# Patient Record
Sex: Female | Born: 1965
Health system: Southern US, Community
[De-identification: ages and names within clinical notes are randomized; demographics above are authoritative.]

## PROBLEM LIST (undated history)

## (undated) DIAGNOSIS — IMO0001 Reserved for inherently not codable concepts without codable children: Secondary | ICD-10-CM

## (undated) DIAGNOSIS — Z923 Personal history of irradiation: Secondary | ICD-10-CM

## (undated) DIAGNOSIS — O039 Complete or unspecified spontaneous abortion without complication: Secondary | ICD-10-CM

## (undated) DIAGNOSIS — IMO0002 Reserved for concepts with insufficient information to code with codable children: Secondary | ICD-10-CM

## (undated) DIAGNOSIS — Z803 Family history of malignant neoplasm of breast: Secondary | ICD-10-CM

## (undated) DIAGNOSIS — Z9889 Other specified postprocedural states: Secondary | ICD-10-CM

## (undated) DIAGNOSIS — C50919 Malignant neoplasm of unspecified site of unspecified female breast: Secondary | ICD-10-CM

## (undated) DIAGNOSIS — R112 Nausea with vomiting, unspecified: Secondary | ICD-10-CM

## (undated) HISTORY — DX: Family history of malignant neoplasm of breast: Z80.3

## (undated) HISTORY — DX: Complete or unspecified spontaneous abortion without complication: O03.9

## (undated) HISTORY — DX: Personal history of irradiation: Z92.3

---

## 2001-12-05 DIAGNOSIS — O039 Complete or unspecified spontaneous abortion without complication: Secondary | ICD-10-CM

## 2001-12-05 HISTORY — DX: Complete or unspecified spontaneous abortion without complication: O03.9

## 2005-12-05 HISTORY — PX: DILATION AND CURETTAGE OF UTERUS: SHX78

## 2013-12-05 HISTORY — PX: BREAST BIOPSY: SHX20

## 2014-05-05 DIAGNOSIS — C50919 Malignant neoplasm of unspecified site of unspecified female breast: Secondary | ICD-10-CM

## 2014-05-05 HISTORY — DX: Malignant neoplasm of unspecified site of unspecified female breast: C50.919

## 2014-05-28 ENCOUNTER — Other Ambulatory Visit: Payer: Self-pay | Admitting: Radiology

## 2014-06-10 ENCOUNTER — Encounter (INDEPENDENT_AMBULATORY_CARE_PROVIDER_SITE_OTHER): Payer: Self-pay | Admitting: General Surgery

## 2014-06-10 ENCOUNTER — Ambulatory Visit (INDEPENDENT_AMBULATORY_CARE_PROVIDER_SITE_OTHER): Payer: Managed Care, Other (non HMO) | Admitting: General Surgery

## 2014-06-10 VITALS — BP 122/76 | HR 55 | Temp 97.6°F | Ht 64.0 in | Wt 132.0 lb

## 2014-06-10 DIAGNOSIS — N6089 Other benign mammary dysplasias of unspecified breast: Secondary | ICD-10-CM

## 2014-06-10 DIAGNOSIS — N6091 Unspecified benign mammary dysplasia of right breast: Secondary | ICD-10-CM | POA: Insufficient documentation

## 2014-06-10 NOTE — Patient Instructions (Signed)
[facility] °Office Phone Number [phone] ° °BREAST BIOPSY/ PARTIAL MASTECTOMY: POST OP INSTRUCTIONS ° °Always review your discharge instruction sheet given to you by the facility where your surgery was performed. ° °IF YOU HAVE DISABILITY OR FAMILY LEAVE FORMS, YOU MUST BRING THEM TO THE OFFICE FOR PROCESSING.  DO NOT GIVE THEM TO YOUR DOCTOR. ° °1. A prescription for pain medication may be given to you upon discharge.  Take your pain medication as prescribed, if needed.  If narcotic pain medicine is not needed, then you may take acetaminophen (Tylenol) or ibuprofen (Advil) as needed. °2. Take your usually prescribed medications unless otherwise directed °3. If you need a refill on your pain medication, please contact your pharmacy.  They will contact our office to request authorization.  Prescriptions will not be filled after 5pm or on week-ends. °4. You should eat very light the first 24 hours after surgery, such as soup, crackers, pudding, etc.  Resume your normal diet the day after surgery. °5. Most patients will experience some swelling and bruising in the breast.  Ice packs and a good support bra will help.  Swelling and bruising can take several days to resolve.  °6. It is common to experience some constipation if taking pain medication after surgery.  Increasing fluid intake and taking a stool softener will usually help or prevent this problem from occurring.  A mild laxative (Milk of Magnesia or Miralax) should be taken according to package directions if there are no bowel movements after 48 hours. °7. Unless discharge instructions indicate otherwise, you may remove your bandages 24-48 hours after surgery, and you may shower at that time.  You may have steri-strips (small skin tapes) in place directly over the incision.  These strips should be left on the skin for 7-10 days.  If your surgeon used skin glue on the incision, you may shower in 24 hours.  The glue will flake off over the  next 2-3 weeks.  Any sutures or staples will be removed at the office during your follow-up visit. °8. ACTIVITIES:  You may resume regular daily activities (gradually increasing) beginning the next day.  Wearing a good support bra or sports bra minimizes pain and swelling.  You may have sexual intercourse when it is comfortable. °a. You may drive when you no longer are taking prescription pain medication, you can comfortably wear a seatbelt, and you can safely maneuver your car and apply brakes. °b. RETURN TO WORK:  ______________________________________________________________________________________ °9. You should see your doctor in the office for a follow-up appointment approximately two weeks after your surgery.  Your doctor’s nurse will typically make your follow-up appointment when she calls you with your pathology report.  Expect your pathology report 2-3 business days after your surgery.  You may call to check if you do not hear from us after three days. °10. OTHER INSTRUCTIONS: _______________________________________________________________________________________________ _____________________________________________________________________________________________________________________________________ °_____________________________________________________________________________________________________________________________________ °_____________________________________________________________________________________________________________________________________ ° °WHEN TO CALL YOUR DOCTOR: °1. Fever over 101.0 °2. Nausea and/or vomiting. °3. Extreme swelling or bruising. °4. Continued bleeding from incision. °5. Increased pain, redness, or drainage from the incision. ° °The clinic staff is available to answer your questions during regular business hours.  Please don’t hesitate to call and ask to speak to one of the nurses for clinical concerns.  If you have a medical emergency, go to the nearest  emergency room or call 911.  A surgeon from [facility] is always on call at the hospital. ° °For further questions, please visit [email or web address]  °

## 2014-06-10 NOTE — Progress Notes (Signed)
Patient ID: Sherry Weber, female   DOB: [birth year], 48 y.o.   MRN: [id number]  Chief Complaint  Patient presents with  . eval right breast    HPI Sherry Weber is a 48 y.o. female.   HPI  She is referred by Dr. Joanell Rising because of right breast atypical ductal hyperplasia.  She noticed a small lump in the right upper outer quadrant for about 2 months. She subsequently went for mammogram and ultrasound. This demonstrated an 8 mm oval lesion in the right breast with microcalcifications in the upper-outer quadrant region that appeared to be suspicious. There is also a small lymph node in the lower right axilla. A stereotactic biopsy of the 8 mm oval lesion in the right breast was performed and demonstrated atypical ductal hyperplasia. This is in a slightly different area than the palpable lump that she felt.  She had a paternal aunt with breast cancer. Age at menarche was 12. She's not had any children. No previous breast biopsies. No nipple discharge. She is otherwise healthy.  History reviewed. No pertinent past medical history.  History reviewed. No pertinent past surgical history.  History reviewed. No pertinent family history.  Social History History  Substance Use Topics  . Smoking status: Never Smoker   . Smokeless tobacco: Not on file  . Alcohol Use: No    No Known Allergies  No current outpatient prescriptions on file.   No current facility-administered medications for this visit.    Review of Systems Review of Systems  All other systems reviewed and are negative.   Blood pressure 122/76, pulse 55, temperature 97.6 F (36.4 C), height 5\' 4"  (1.626 m), weight 132 lb (59.875 kg).  Physical Exam Physical Exam  Constitutional: She appears well-developed and well-nourished. No distress.  HENT:  Head: Normocephalic and atraumatic.  Eyes: No scleral icterus.  Neck: Neck supple.  Cardiovascular: Normal rate and regular rhythm.   Pulmonary/Chest: Effort normal and breath  sounds normal.  Breasts are symmetrical in size. Left breast demonstrates no dominant masses or suspicious skin changes. Right breast demonstrates a small puncture wound laterally. No dominant masses.  Musculoskeletal: She exhibits no edema.  In the lower right axilla, there is a small 8-10 cm mobile subcutaneous soft tissue mass.  Lymphadenopathy:    She has no cervical adenopathy.  Neurological: She is alert.  Skin: Skin is warm and dry.  Psychiatric: She has a normal mood and affect. Her behavior is normal.    Data Reviewed Mammogram. Mammogram report. Pathology report.  Assessment    Atypical ductal hyperplasia of the right breast. Probably also has what appears to be a benign lymph node pending final pathology of the breast lesion.     Plan    I recommended right breast lumpectomy/biopsy after wire localization. I explained to her that this is a potentially premalignant condition.  I have explained the procedure, risks, and aftercare to her.  Risks include but are not limited to bleeding, infection, wound problems, breast deformity, anesthesia.  She seems to understand and agrees with the plan.        Tarig Zimmers J 06/10/2014, 5:01 PM

## 2014-06-25 ENCOUNTER — Encounter (HOSPITAL_BASED_OUTPATIENT_CLINIC_OR_DEPARTMENT_OTHER)
Admission: RE | Admit: 2014-06-25 | Discharge: 2014-06-25 | Disposition: A | Discharge status: Home / Self Care | Payer: Private Health Insurance - Indemnity | Source: Ambulatory Visit | Attending: General Surgery | Admitting: General Surgery

## 2014-06-25 ENCOUNTER — Encounter (HOSPITAL_BASED_OUTPATIENT_CLINIC_OR_DEPARTMENT_OTHER): Payer: Self-pay | Admitting: *Deleted

## 2014-06-25 LAB — CBC WITH DIFFERENTIAL/PLATELET
BASOS ABS: 0.1 10*3/uL (ref 0.0–0.1)
BASOS PCT: 1 % (ref 0–1)
EOS ABS: 0.2 10*3/uL (ref 0.0–0.7)
EOS PCT: 5 % (ref 0–5)
HCT: 40.9 % (ref 36.0–46.0)
Hemoglobin: 13.8 g/dL (ref 12.0–15.0)
LYMPHS ABS: 1.6 10*3/uL (ref 0.7–4.0)
Lymphocytes Relative: 34 % (ref 12–46)
MCH: 31.4 pg (ref 26.0–34.0)
MCHC: 33.7 g/dL (ref 30.0–36.0)
MCV: 93 fL (ref 78.0–100.0)
Monocytes Absolute: 0.3 10*3/uL (ref 0.1–1.0)
Monocytes Relative: 7 % (ref 3–12)
Neutro Abs: 2.5 10*3/uL (ref 1.7–7.7)
Neutrophils Relative %: 53 % (ref 43–77)
PLATELETS: 295 10*3/uL (ref 150–400)
RBC: 4.4 MIL/uL (ref 3.87–5.11)
RDW: 12.4 % (ref 11.5–15.5)
WBC: 4.7 10*3/uL (ref 4.0–10.5)

## 2014-06-25 LAB — PROTIME-INR
INR: 0.91 (ref 0.00–1.49)
Prothrombin Time: 12.3 seconds (ref 11.6–15.2)

## 2014-06-25 LAB — COMPREHENSIVE METABOLIC PANEL
ALBUMIN: 3.9 g/dL (ref 3.5–5.2)
ALT: 29 U/L (ref 0–35)
ANION GAP: 11 (ref 5–15)
AST: 25 U/L (ref 0–37)
Alkaline Phosphatase: 46 U/L (ref 39–117)
BUN: 15 mg/dL (ref 6–23)
CALCIUM: 8.8 mg/dL (ref 8.4–10.5)
CO2: 28 mEq/L (ref 19–32)
Chloride: 101 mEq/L (ref 96–112)
Creatinine, Ser: 0.65 mg/dL (ref 0.50–1.10)
GFR calc non Af Amer: >90 mL/min (ref >90)
GLUCOSE: 106 mg/dL — AB (ref 70–99)
Potassium: 3.9 mEq/L (ref 3.7–5.3)
SODIUM: 140 meq/L (ref 137–147)
Total Bilirubin: 0.4 mg/dL (ref 0.3–1.2)
Total Protein: 6.7 g/dL (ref 6.0–8.3)

## 2014-06-25 NOTE — Progress Notes (Signed)
To come in for [facility] labs-from [place]-but speaks english well-husband [place]

## 2014-06-27 ENCOUNTER — Encounter (HOSPITAL_BASED_OUTPATIENT_CLINIC_OR_DEPARTMENT_OTHER): Payer: Self-pay | Admitting: Anesthesiology

## 2014-06-27 ENCOUNTER — Ambulatory Visit (HOSPITAL_BASED_OUTPATIENT_CLINIC_OR_DEPARTMENT_OTHER): Payer: Private Health Insurance - Indemnity | Admitting: Anesthesiology

## 2014-06-27 ENCOUNTER — Ambulatory Visit (HOSPITAL_BASED_OUTPATIENT_CLINIC_OR_DEPARTMENT_OTHER)
Admission: RE | Admit: 2014-06-27 | Discharge: 2014-06-27 | Disposition: A | Discharge status: Home / Self Care | Payer: Private Health Insurance - Indemnity | Source: Ambulatory Visit | Attending: General Surgery | Admitting: General Surgery

## 2014-06-27 ENCOUNTER — Encounter (HOSPITAL_BASED_OUTPATIENT_CLINIC_OR_DEPARTMENT_OTHER)
Admission: RE | Disposition: A | Discharge status: Home / Self Care | Payer: Self-pay | Source: Ambulatory Visit | Attending: General Surgery

## 2014-06-27 ENCOUNTER — Encounter (HOSPITAL_BASED_OUTPATIENT_CLINIC_OR_DEPARTMENT_OTHER): Payer: Private Health Insurance - Indemnity | Admitting: Anesthesiology

## 2014-06-27 DIAGNOSIS — D0590 Unspecified type of carcinoma in situ of unspecified breast: Secondary | ICD-10-CM

## 2014-06-27 DIAGNOSIS — Z803 Family history of malignant neoplasm of breast: Secondary | ICD-10-CM | POA: Insufficient documentation

## 2014-06-27 DIAGNOSIS — Z01812 Encounter for preprocedural laboratory examination: Secondary | ICD-10-CM | POA: Insufficient documentation

## 2014-06-27 HISTORY — PX: BREAST SURGERY: SHX581

## 2014-06-27 HISTORY — PX: BREAST LUMPECTOMY WITH NEEDLE LOCALIZATION: SHX5759

## 2014-06-27 HISTORY — PX: BREAST LUMPECTOMY: SHX2

## 2014-06-27 SURGERY — BREAST LUMPECTOMY WITH NEEDLE LOCALIZATION
Anesthesia: General | Site: Breast | Laterality: Right

## 2014-06-27 MED ORDER — MORPHINE SULFATE 2 MG/ML IJ SOLN: 2.0000 mg | INTRAMUSCULAR | Status: DC | PRN

## 2014-06-27 MED ORDER — SODIUM BICARBONATE 4 % IV SOLN
INTRAVENOUS | Status: AC
  Filled 2014-06-27: qty 5

## 2014-06-27 MED ORDER — FENTANYL CITRATE 0.05 MG/ML IJ SOLN: 50.0000 ug | INTRAMUSCULAR | Status: DC | PRN

## 2014-06-27 MED ORDER — BUPIVACAINE HCL (PF) 0.5 % IJ SOLN
INTRAMUSCULAR | Status: DC | PRN
  Administered 2014-06-27: 14 mL

## 2014-06-27 MED ORDER — PROPOFOL 10 MG/ML IV BOLUS
INTRAVENOUS | Status: AC
  Filled 2014-06-27: qty 20

## 2014-06-27 MED ORDER — BUPIVACAINE HCL (PF) 0.5 % IJ SOLN
INTRAMUSCULAR | Status: AC
  Filled 2014-06-27: qty 30

## 2014-06-27 MED ORDER — LIDOCAINE HCL (PF) 1 % IJ SOLN
INTRAMUSCULAR | Status: AC
  Filled 2014-06-27: qty 30

## 2014-06-27 MED ORDER — FENTANYL CITRATE 0.05 MG/ML IJ SOLN
INTRAMUSCULAR | Status: DC | PRN
  Administered 2014-06-27: 25 ug via INTRAVENOUS
  Administered 2014-06-27: 50 ug via INTRAVENOUS
  Administered 2014-06-27: 25 ug via INTRAVENOUS

## 2014-06-27 MED ORDER — LACTATED RINGERS IV SOLN
INTRAVENOUS | Status: DC
  Administered 2014-06-27 (×2): via INTRAVENOUS

## 2014-06-27 MED ORDER — OXYCODONE HCL 5 MG PO TABS: 5.0000 mg | ORAL_TABLET | ORAL | Status: DC | PRN

## 2014-06-27 MED ORDER — HYDROCODONE-ACETAMINOPHEN 5-325 MG PO TABS: 1.0000 | ORAL_TABLET | ORAL | Status: DC | PRN

## 2014-06-27 MED ORDER — DEXAMETHASONE SODIUM PHOSPHATE 4 MG/ML IJ SOLN
INTRAMUSCULAR | Status: DC | PRN
  Administered 2014-06-27: 8 mg via INTRAVENOUS

## 2014-06-27 MED ORDER — PROPOFOL 10 MG/ML IV BOLUS
INTRAVENOUS | Status: DC | PRN
  Administered 2014-06-27: 110 mg via INTRAVENOUS

## 2014-06-27 MED ORDER — MIDAZOLAM HCL 5 MG/5ML IJ SOLN
INTRAMUSCULAR | Status: DC | PRN
  Administered 2014-06-27: 2 mg via INTRAVENOUS

## 2014-06-27 MED ORDER — FENTANYL CITRATE 0.05 MG/ML IJ SOLN
INTRAMUSCULAR | Status: AC
  Filled 2014-06-27: qty 4

## 2014-06-27 MED ORDER — ONDANSETRON HCL 4 MG/2ML IJ SOLN
INTRAMUSCULAR | Status: DC | PRN
  Administered 2014-06-27: 4 mg via INTRAVENOUS

## 2014-06-27 MED ORDER — LIDOCAINE HCL (CARDIAC) 20 MG/ML IV SOLN
INTRAVENOUS | Status: DC | PRN
  Administered 2014-06-27: 80 mg via INTRAVENOUS

## 2014-06-27 MED ORDER — OXYCODONE HCL 5 MG PO TABS
ORAL_TABLET | ORAL | Status: AC
  Filled 2014-06-27: qty 1

## 2014-06-27 MED ORDER — PROMETHAZINE HCL 25 MG/ML IJ SOLN: 6.2500 mg | INTRAMUSCULAR | Status: DC | PRN

## 2014-06-27 MED ORDER — ACETAMINOPHEN 325 MG PO TABS: 650.0000 mg | ORAL_TABLET | ORAL | Status: DC | PRN

## 2014-06-27 MED ORDER — CEFAZOLIN SODIUM-DEXTROSE 2-3 GM-% IV SOLR
2.0000 g | INTRAVENOUS | Status: AC
  Administered 2014-06-27: 2 g via INTRAVENOUS

## 2014-06-27 MED ORDER — ACETAMINOPHEN 500 MG PO TABS
ORAL_TABLET | ORAL | Status: AC
  Filled 2014-06-27: qty 2

## 2014-06-27 MED ORDER — OXYCODONE HCL 5 MG/5ML PO SOLN: 5.0000 mg | Freq: Once | ORAL | Status: AC | PRN

## 2014-06-27 MED ORDER — MIDAZOLAM HCL 2 MG/2ML IJ SOLN
INTRAMUSCULAR | Status: AC
  Filled 2014-06-27: qty 2

## 2014-06-27 MED ORDER — MIDAZOLAM HCL 2 MG/2ML IJ SOLN: 1.0000 mg | INTRAMUSCULAR | Status: DC | PRN

## 2014-06-27 MED ORDER — ACETAMINOPHEN 650 MG RE SUPP
650.0000 mg | RECTAL | Status: DC | PRN
Start: 2014-06-27 — End: 2014-06-27

## 2014-06-27 MED ORDER — SODIUM CHLORIDE 0.9 % IJ SOLN: 3.0000 mL | INTRAMUSCULAR | Status: DC | PRN

## 2014-06-27 MED ORDER — ACETAMINOPHEN 500 MG PO TABS
1000.0000 mg | ORAL_TABLET | Freq: Once | ORAL | Status: AC
  Administered 2014-06-27: 1000 mg via ORAL

## 2014-06-27 MED ORDER — HYDROMORPHONE HCL PF 1 MG/ML IJ SOLN: 0.2500 mg | INTRAMUSCULAR | Status: DC | PRN

## 2014-06-27 MED ORDER — OXYCODONE HCL 5 MG PO TABS
5.0000 mg | ORAL_TABLET | Freq: Once | ORAL | Status: AC | PRN
  Administered 2014-06-27: 5 mg via ORAL

## 2014-06-27 SURGICAL SUPPLY — 45 items
BENZOIN TINCTURE PRP APPL 2/3 (GAUZE/BANDAGES/DRESSINGS) ×3 IMPLANT
BINDER BREAST LRG (GAUZE/BANDAGES/DRESSINGS) IMPLANT
BINDER BREAST MEDIUM (GAUZE/BANDAGES/DRESSINGS) ×3 IMPLANT
BINDER BREAST XLRG (GAUZE/BANDAGES/DRESSINGS) IMPLANT
BINDER BREAST XXLRG (GAUZE/BANDAGES/DRESSINGS) IMPLANT
BLADE SURG 15 STRL LF DISP TIS (BLADE) ×1 IMPLANT
BLADE SURG 15 STRL SS (BLADE) ×2
CANISTER SUCT 1200ML W/VALVE (MISCELLANEOUS) IMPLANT
CHLORAPREP W/TINT 26ML (MISCELLANEOUS) ×3 IMPLANT
CLOSURE WOUND 1/2 X4 (GAUZE/BANDAGES/DRESSINGS) ×1
COVER MAYO STAND STRL (DRAPES) ×3 IMPLANT
COVER TABLE BACK 60X90 (DRAPES) ×3 IMPLANT
DECANTER SPIKE VIAL GLASS SM (MISCELLANEOUS) ×3 IMPLANT
DEVICE DUBIN W/COMP PLATE 8390 (MISCELLANEOUS) IMPLANT
DRAPE PED LAPAROTOMY (DRAPES) ×3 IMPLANT
DRAPE UTILITY XL STRL (DRAPES) ×3 IMPLANT
ELECT COATED BLADE 2.86 ST (ELECTRODE) ×3 IMPLANT
ELECT REM PT RETURN 9FT ADLT (ELECTROSURGICAL) ×3
ELECTRODE REM PT RTRN 9FT ADLT (ELECTROSURGICAL) ×1 IMPLANT
GAUZE SPONGE 4X4 12PLY STRL (GAUZE/BANDAGES/DRESSINGS) ×3 IMPLANT
GLOVE BIO SURGEON STRL SZ 6.5 (GLOVE) ×2 IMPLANT
GLOVE BIO SURGEONS STRL SZ 6.5 (GLOVE) ×1
GLOVE BIOGEL PI IND STRL 7.0 (GLOVE) ×2 IMPLANT
GLOVE BIOGEL PI IND STRL 8.5 (GLOVE) ×1 IMPLANT
GLOVE BIOGEL PI INDICATOR 7.0 (GLOVE) ×4
GLOVE BIOGEL PI INDICATOR 8.5 (GLOVE) ×2
GLOVE ECLIPSE 8.0 STRL XLNG CF (GLOVE) ×3 IMPLANT
GOWN STRL REUS W/ TWL LRG LVL3 (GOWN DISPOSABLE) ×2 IMPLANT
GOWN STRL REUS W/TWL LRG LVL3 (GOWN DISPOSABLE) ×4
NEEDLE HYPO 25X1 1.5 SAFETY (NEEDLE) ×3 IMPLANT
NS IRRIG 1000ML POUR BTL (IV SOLUTION) ×3 IMPLANT
PACK BASIN DAY SURGERY FS (CUSTOM PROCEDURE TRAY) ×3 IMPLANT
PENCIL BUTTON HOLSTER BLD 10FT (ELECTRODE) ×3 IMPLANT
SLEEVE SCD COMPRESS KNEE MED (MISCELLANEOUS) IMPLANT
SPONGE GAUZE 4X4 12PLY STER LF (GAUZE/BANDAGES/DRESSINGS) ×3 IMPLANT
STRIP CLOSURE SKIN 1/2X4 (GAUZE/BANDAGES/DRESSINGS) ×2 IMPLANT
SUT MON AB 4-0 PC3 18 (SUTURE) ×3 IMPLANT
SUT SILK 2 0 FS (SUTURE) ×3 IMPLANT
SUT VICRYL 3-0 CR8 SH (SUTURE) ×3 IMPLANT
SYRINGE CONTROL L 12CC (SYRINGE) ×3 IMPLANT
TOWEL OR 17X24 6PK STRL BLUE (TOWEL DISPOSABLE) ×6 IMPLANT
TOWEL OR NON WOVEN STRL DISP B (DISPOSABLE) ×3 IMPLANT
TUBE CONNECTING 20'X1/4 (TUBING)
TUBE CONNECTING 20X1/4 (TUBING) IMPLANT
YANKAUER SUCT BULB TIP NO VENT (SUCTIONS) IMPLANT

## 2014-06-27 NOTE — Anesthesia Preprocedure Evaluation (Signed)
Anesthesia Evaluation  Patient identified by MRN, date of birth, ID band Patient awake    Reviewed: Allergy & Precautions, H&P , NPO status , Patient's Chart, lab work & pertinent test results  History of Anesthesia Complications Negative for: history of anesthetic complications  Airway Mallampati: I  Neck ROM: Full    Dental no notable dental hx. (+) Teeth Intact   Pulmonary neg pulmonary ROS,  breath sounds clear to auscultation  Pulmonary exam normal       Cardiovascular negative cardio ROS  IRhythm:Regular Rate:Normal     Neuro/Psych negative neurological ROS  negative psych ROS   GI/Hepatic negative GI ROS, Neg liver ROS,   Endo/Other  negative endocrine ROS  Renal/GU negative Renal ROS  negative genitourinary   Musculoskeletal   Abdominal   Peds  Hematology negative hematology ROS (+)   Anesthesia Other Findings   Reproductive/Obstetrics negative OB ROS                           Anesthesia Physical Anesthesia Plan  ASA: I  Anesthesia Plan: General   Post-op Pain Management:    Induction: Intravenous  Airway Management Planned: LMA  Additional Equipment:   Intra-op Plan:   Post-operative Plan: Extubation in OR  Informed Consent: I have reviewed the patients History and Physical, chart, labs and discussed the procedure including the risks, benefits and alternatives for the proposed anesthesia with the patient or authorized representative who has indicated his/her understanding and acceptance.     Plan Discussed with: CRNA and Surgeon  Anesthesia Plan Comments:         Anesthesia Quick Evaluation

## 2014-06-27 NOTE — Transfer of Care (Signed)
Immediate Anesthesia Transfer of Care Note  Patient: Sherry Weber  Procedure(s) Performed: Procedure(s): RIGHT BREAST LUMPECTOMY WITH NEEDLE LOCALIZATION (Right)  Patient Location: PACU  Anesthesia Type:General  Level of Consciousness: sedated  Airway & Oxygen Therapy: Patient Spontanous Breathing and Patient connected to face mask oxygen  Post-op Assessment: Report given to PACU RN and Post -op Vital signs reviewed and stable  Post vital signs: Reviewed and stable  Complications: No apparent anesthesia complications

## 2014-06-27 NOTE — H&P (View-Only) (Signed)
Patient ID: Sherry Weber, female   DOB: 1966/03/20, 48 y.o.   MRN: [id number]  Chief Complaint  Patient presents with  . eval right breast    HPI Sherry Weber is a 48 y.o. female.   HPI  She is referred by Dr. Joanell Rising because of right breast atypical ductal hyperplasia.  She noticed a small lump in the right upper outer quadrant for about 2 months. She subsequently went for mammogram and ultrasound. This demonstrated an 8 mm oval lesion in the right breast with microcalcifications in the upper-outer quadrant region that appeared to be suspicious. There is also a small lymph node in the lower right axilla. A stereotactic biopsy of the 8 mm oval lesion in the right breast was performed and demonstrated atypical ductal hyperplasia. This is in a slightly different area than the palpable lump that she felt.  She had a paternal aunt with breast cancer. Age at menarche was 69. She's not had any children. No previous breast biopsies. No nipple discharge. She is otherwise healthy.  History reviewed. No pertinent past medical history.  History reviewed. No pertinent past surgical history.  History reviewed. No pertinent family history.  Social History History  Substance Use Topics  . Smoking status: Never Smoker   . Smokeless tobacco: Not on file  . Alcohol Use: No    No Known Allergies  No current outpatient prescriptions on file.   No current facility-administered medications for this visit.    Review of Systems Review of Systems  All other systems reviewed and are negative.   Blood pressure 122/76, pulse 55, temperature 97.6 F (36.4 C), height 5\' 4"  (1.626 m), weight 132 lb (59.875 kg).  Physical Exam Physical Exam  Constitutional: She appears well-developed and well-nourished. No distress.  HENT:  Head: Normocephalic and atraumatic.  Eyes: No scleral icterus.  Neck: Neck supple.  Cardiovascular: Normal rate and regular rhythm.   Pulmonary/Chest: Effort normal and breath  sounds normal.  Breasts are symmetrical in size. Left breast demonstrates no dominant masses or suspicious skin changes. Right breast demonstrates a small puncture wound laterally. No dominant masses.  Musculoskeletal: She exhibits no edema.  In the lower right axilla, there is a small 8-10 cm mobile subcutaneous soft tissue mass.  Lymphadenopathy:    She has no cervical adenopathy.  Neurological: She is alert.  Skin: Skin is warm and dry.  Psychiatric: She has a normal mood and affect. Her behavior is normal.    Data Reviewed Mammogram. Mammogram report. Pathology report.  Assessment    Atypical ductal hyperplasia of the right breast. Probably also has what appears to be a benign lymph node pending final pathology of the breast lesion.     Plan    I recommended right breast lumpectomy/biopsy after wire localization. I explained to her that this is a potentially premalignant condition.  I have explained the procedure, risks, and aftercare to her.  Risks include but are not limited to bleeding, infection, wound problems, breast deformity, anesthesia.  She seems to understand and agrees with the plan.        Sherry Weber 06/10/2014, 5:01 PM

## 2014-06-27 NOTE — Anesthesia Postprocedure Evaluation (Signed)
  Anesthesia Post-op Note  Patient: Sherry Weber  Procedure(s) Performed: Procedure(s): RIGHT BREAST LUMPECTOMY WITH NEEDLE LOCALIZATION (Right)  Patient Location: PACU  Anesthesia Type:General  Level of Consciousness: awake and alert   Airway and Oxygen Therapy: Patient Spontanous Breathing  Post-op Pain: mild  Post-op Assessment: Post-op Vital signs reviewed  Post-op Vital Signs: stable  Last Vitals:  Filed Vitals:   06/27/14 1235  BP: 137/86  Pulse: 49  Temp: 36.2 C  Resp: 18    Complications: No apparent anesthesia complications

## 2014-06-27 NOTE — Anesthesia Procedure Notes (Signed)
Procedure Name: LMA Insertion Date/Time: 06/27/2014 10:25 AM Performed by: Maryella Shivers Pre-anesthesia Checklist: Patient identified, Emergency Drugs available, Suction available and Patient being monitored Patient Re-evaluated:Patient Re-evaluated prior to inductionOxygen Delivery Method: Circle System Utilized Preoxygenation: Pre-oxygenation with 100% oxygen Intubation Type: IV induction Ventilation: Mask ventilation without difficulty LMA: LMA inserted LMA Size: 4.0 Number of attempts: 1 Airway Equipment and Method: bite block Placement Confirmation: positive ETCO2 Tube secured with: Tape Dental Injury: Teeth and Oropharynx as per pre-operative assessment

## 2014-06-27 NOTE — Op Note (Signed)
Operative Note  Sherry Weber female 48 y.o. 06/27/2014  PREOPERATIVE DX:  Right breast atypical ductal hyperplasia  POSTOPERATIVE DX:  Same  PROCEDURE:  Right breast lumpectomy after wire localization         Surgeon: [name]   Assistants: none  Anesthesia: General mask inhalational anesthesia and General LMA anesthesia  Indications:   This is a 48 year old female who felt a small lump in the upper outer quadrant of her right breast for about 2 months. She subsequently underwent a mammogram and ultrasound. This demonstrated a small, 8 mm, with oval lesion in the right breast with microcalcifications somewhat close to where she felt the lump. Biopsy demonstrated atypical ductal hyperplasia. She now presents for the above procedure.    Procedure Detail:  She underwent successful wire localization. She was seen in the holding area in the right breast mark my initials. She was brought to the operating room placed supine on the operating table Gen. Anesthesia was given. The bandage on the right breast was removed and the wire was cut closer to the skin. The wire was located at the 9:00 position of the right breast. The wire and breast were sterilely prepped and draped.  Marcaine solution was infiltrated in the lateral right breast area. A curvilinear incision was made through the skin and subcutaneous tissue to include the wire. Using electrocautery a lumpectomy was performed around the midportion and tip of the wire. Once the specimen was removed it was oriented with sutures. A specimen mammogram was performed. This demonstrated the area of concern to be in the middle of the specimen. This was verified by the radiologist. The specimen was then sent to pathology.  Was inspected and bleeding was controlled with electrocautery. Local anesthetic consisting of Marcaine solution was infiltrated into the subcutaneous tissue to the bone. Subcutaneous tissues were approximated using  interrupted 3-0 Vicryl sutures. The skin is closed with a running 4 Monocryl subcuticular stitch. Steri-Strips and a sterile dressing were applied.  She tolerated the procedure well without any apparent complications and was taken to the recovery room in satisfactory condition.   Estimated Blood Loss:  less than 100 mL         Drains: none  Blood Given: none          Specimens: Right breast tissue        Complications:  * No complications entered in OR log *         Disposition: PACU - hemodynamically stable.         Condition: stable

## 2014-06-27 NOTE — Discharge Instructions (Signed)
[place] Office Phone Number [phone]  BREAST BIOPSY/ PARTIAL MASTECTOMY: POST OP INSTRUCTIONS  Always review your discharge instruction sheet given to you by the facility where your surgery was performed.  IF YOU HAVE DISABILITY OR FAMILY LEAVE FORMS, YOU MUST BRING THEM TO THE OFFICE FOR PROCESSING.  DO NOT GIVE THEM TO YOUR DOCTOR.  1. A prescription for pain medication may be given to you upon discharge.  Take your pain medication as prescribed, if needed.  If narcotic pain medicine is not needed, then you may take acetaminophen (Tylenol) or ibuprofen (Advil) as needed. 2. Take your usually prescribed medications unless otherwise directed 3. If you need a refill on your pain medication, please contact your pharmacy.  They will contact our office to request authorization.  Prescriptions will not be filled after 5pm or on week-ends. 4. You should eat very light the first 24 hours after surgery, such as soup, crackers, pudding, etc.  Resume your normal diet the day after surgery. 5. Most patients will experience some swelling and bruising in the breast.  Ice packs and a good support bra will help.  Swelling and bruising can take several days to resolve.  6. It is common to experience some constipation if taking pain medication after surgery.  Increasing fluid intake and taking a stool softener will usually help or prevent this problem from occurring.  A mild laxative (Milk of Magnesia or Miralax) should be taken according to package directions if there are no bowel movements after 48 hours. 7. Unless discharge instructions indicate otherwise, you may remove your bandages 48 hours after surgery, and you may shower at that time.  You may have steri-strips (small skin tapes) in place directly over the incision.  These strips should be left on the skin for 10 days.  If your surgeon used skin glue on the incision, you may shower in 24 hours.  The glue will flake off over the next  2-3 weeks.  Any sutures or staples will be removed at the office during your follow-up visit. 8. ACTIVITIES:  You may resume regular daily activities (gradually increasing) beginning the next day.  Wearing a good support bra or sports bra minimizes pain and swelling.  You may have sexual intercourse when it is comfortable. a. You may drive when you no longer are taking prescription pain medication, you can comfortably wear a seatbelt, and you can safely maneuver your car and apply brakes. b. RETURN TO WORK:  3-5 days when comfortable._____________________________________________________________________________________ 9. You should see your doctor in the office for a follow-up appointment approximately two weeks after your surgery.  Please call and make this appointment.  Expect your pathology report 3 business days after your surgery.  You may call to check if you do not hear from [place] after three days. 10. OTHER INSTRUCTIONS: _______________________________________________________________________________________________ _____________________________________________________________________________________________________________________________________ _____________________________________________________________________________________________________________________________________ _____________________________________________________________________________________________________________________________________  WHEN TO CALL YOUR DOCTOR: 1. Fever over 101.0 2. Nausea and/or vomiting. 3. Extreme swelling or bruising. 4. Continued bleeding from incision. 5. Increased pain, redness, or drainage from the incision.  The clinic staff is available to answer your questions during regular business hours.  Please dont hesitate to call and ask to speak to one of the nurses for clinical concerns.  If you have a medical emergency, go to the nearest emergency room or call 911.  A surgeon from [facility] is always on call at the hospital.  For further questions, please visit [email or web address]    Post Anesthesia Home Care Instructions  Activity: Get plenty of rest for the remainder of the day. A responsible adult should stay with you for 24 hours following the procedure.  For the next 24 hours, DO NOT: -Drive a car -Paediatric nurse -Drink alcoholic beverages -Take any medication unless instructed by your physician -Make any legal decisions or sign important papers.  Meals: Start with liquid foods such as gelatin or soup. Progress to regular foods as tolerated. Avoid greasy, spicy, heavy foods. If nausea and/or vomiting occur, drink only clear liquids until the nausea and/or vomiting subsides. Call your physician if vomiting continues.  Special Instructions/Symptoms: Your throat may feel dry or sore from the anesthesia or the breathing tube placed in your throat during surgery. If this causes discomfort, gargle with warm salt water. The discomfort should disappear within 24 hours.

## 2014-06-27 NOTE — Interval H&P Note (Signed)
History and Physical Interval Note:  06/27/2014 10:17 AM  Sherry Weber  has presented today for surgery, with the diagnosis of atypical ductal hyperplasia right breast  The various methods of treatment have been discussed with the patient and family. After consideration of risks, benefits and other options for treatment, the patient has consented to  Procedure(s): RIGHT BREAST LUMPECTOMY WITH NEEDLE LOCALIZATION (Right) as a surgical intervention .  The patient's history has been reviewed, patient examined, no change in status, stable for surgery.  I have reviewed the patient's chart and labs.  Questions were answered to the patient's satisfaction.     Chaundra Abreu Lenna Sciara

## 2014-06-30 ENCOUNTER — Encounter (HOSPITAL_BASED_OUTPATIENT_CLINIC_OR_DEPARTMENT_OTHER): Payer: Self-pay | Admitting: General Surgery

## 2014-07-01 ENCOUNTER — Telehealth (INDEPENDENT_AMBULATORY_CARE_PROVIDER_SITE_OTHER): Payer: Self-pay

## 2014-07-01 NOTE — Telephone Encounter (Signed)
Gave the patient her pathology results.  DCIS, margins not involved. This is good news.  Patient had a difficult time understanding the diagnosis.  I told her Dr. Zella Richer would discuss in detail at her upcoming appointment in August.

## 2014-07-01 NOTE — Telephone Encounter (Signed)
Pt has some limitations with English, so is calling again still concerned about her diagnosis.  I explained that her cancer was non-invasive and contained within the ducts in the breast.  All of the tumor was removed and her mammogram showed that all margins were also clear of any cancer.  She may be referred to oncology just to establish care.  She was very appreciative of the call and will discuss her results further with Dr. Zella Richer at her post op appointment in August.

## 2014-07-17 ENCOUNTER — Encounter (INDEPENDENT_AMBULATORY_CARE_PROVIDER_SITE_OTHER): Payer: Self-pay | Admitting: General Surgery

## 2014-07-17 ENCOUNTER — Other Ambulatory Visit (INDEPENDENT_AMBULATORY_CARE_PROVIDER_SITE_OTHER): Payer: Self-pay | Admitting: General Surgery

## 2014-07-17 ENCOUNTER — Ambulatory Visit (INDEPENDENT_AMBULATORY_CARE_PROVIDER_SITE_OTHER): Payer: Managed Care, Other (non HMO) | Admitting: General Surgery

## 2014-07-17 VITALS — BP 122/78 | HR 58 | Temp 97.2°F | Ht 68.0 in | Wt 132.0 lb

## 2014-07-17 DIAGNOSIS — D0512 Intraductal carcinoma in situ of left breast: Secondary | ICD-10-CM

## 2014-07-17 DIAGNOSIS — D0511 Intraductal carcinoma in situ of right breast: Secondary | ICD-10-CM | POA: Insufficient documentation

## 2014-07-17 DIAGNOSIS — D0590 Unspecified type of carcinoma in situ of unspecified breast: Secondary | ICD-10-CM

## 2014-07-17 NOTE — Patient Instructions (Signed)
We will refer you to the oncologist and that person will discuss further treatment with you

## 2014-07-17 NOTE — Progress Notes (Signed)
Procedure:  Right breast lumpectomy after wire localization  Date:  06/27/2014  Pathology: Right breast Low-grade ductal carcinoma in situ. Margins are clear. Estrogen receptor and progesterone receptor positive.  History:  She is here for her first postoperative visit. She has some discomfort around the incision.  Exam: General- Is in NAD. 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 I have discussed this with her.  Plan:  Referral to medical oncology. Return visit 3 months.

## 2014-07-22 ENCOUNTER — Telehealth: Payer: Self-pay | Admitting: *Deleted

## 2014-07-22 NOTE — Telephone Encounter (Signed)
Called pt and got the voicemail but it has not been set up yet.  Will call again later to schedule a med onc appt.

## 2014-07-24 ENCOUNTER — Telehealth: Payer: Self-pay | Admitting: *Deleted

## 2014-07-24 NOTE — Telephone Encounter (Signed)
Called pt and confirmed 08/13/14 appt w/ her.  Mailed before appt letter, welcoming packet & intake form to pt.  Emailed Jearld Fenton and [place] at [employer] to make them aware.

## 2014-07-25 ENCOUNTER — Other Ambulatory Visit (INDEPENDENT_AMBULATORY_CARE_PROVIDER_SITE_OTHER): Payer: Self-pay | Admitting: General Surgery

## 2014-07-25 DIAGNOSIS — D0512 Intraductal carcinoma in situ of left breast: Secondary | ICD-10-CM

## 2014-07-30 ENCOUNTER — Encounter: Payer: Self-pay | Admitting: Radiation Oncology

## 2014-07-30 NOTE — Progress Notes (Signed)
Location of Breast Cancer:Right Breast upper-outer quadrant.8 mm   Histology per Pathology Report:05/28/14:FINAL DIAGNOSIS Diagnosis Breast, right, needle core biopsy - ATYPICAL DUCTAL HYPERPLASIA SEE COMMENT. - CALCIFICATIONS IDENTIFEID  Receptor Status: ER(+), PR (+), Her2-neu ()  Did patient present with symptoms (if so, please note symptoms) or was this found on screening mammography?:Patient noted lump in right upper quadrant for about 2 months, states it was a strange sensation and "felt like stretching of skin especially on reaching up high and during yoga exercises.  Past/Anticipated interventions by surgeon, if BEE:FEOFH breast lumpectomy  06/27/14 by Dr.Todd Zella Richer  Past/Anticipated interventions by medical oncology, if any: Chemotherapy:Scheduled for consultation with Dr.Gudena on 08/13/14.  Lymphedema issues, if any:No  Pain issues, if any:No  SAFETY ISSUES:  Prior radiation? No  Pacemaker/ICD?No  Possible current pregnancy?Menstrual cycle started 07/29/14  Is the patient on methotrexate?No  Current Complaints / other details:Married.menarche age 35. No children.had a miscarriage in 2007 at age 27. No significant health problems.A resident of [place] from [place] about 7 years and has lived in [place] for 2 years. Paternal aunt diagnosed with breast cancer in her 38's. NKDA.     Arlyss Repress, RN 07/30/2014,10:58 AM

## 2014-07-31 ENCOUNTER — Ambulatory Visit
Admission: RE | Admit: 2014-07-31 | Discharge: 2014-07-31 | Disposition: A | Discharge status: Home / Self Care | Payer: Private Health Insurance - Indemnity | Source: Ambulatory Visit | Attending: Radiation Oncology | Admitting: Radiation Oncology

## 2014-07-31 ENCOUNTER — Encounter: Payer: Self-pay | Admitting: Radiation Oncology

## 2014-07-31 VITALS — BP 114/68 | HR 57 | Temp 98.2°F | Wt 128.1 lb

## 2014-07-31 DIAGNOSIS — Z803 Family history of malignant neoplasm of breast: Secondary | ICD-10-CM | POA: Diagnosis not present

## 2014-07-31 DIAGNOSIS — D0511 Intraductal carcinoma in situ of right breast: Secondary | ICD-10-CM

## 2014-07-31 DIAGNOSIS — Z510 Encounter for antineoplastic radiation therapy: Secondary | ICD-10-CM | POA: Insufficient documentation

## 2014-07-31 DIAGNOSIS — D0590 Unspecified type of carcinoma in situ of unspecified breast: Secondary | ICD-10-CM | POA: Diagnosis not present

## 2014-07-31 DIAGNOSIS — Z170 Estrogen receptor positive status [ER+]: Secondary | ICD-10-CM | POA: Diagnosis not present

## 2014-07-31 HISTORY — DX: Malignant neoplasm of unspecified site of unspecified female breast: C50.919

## 2014-07-31 NOTE — Progress Notes (Signed)
Radiation Oncology         [phone]) [phone] ________________________________  Initial outpatient Consultation - Date: 07/31/2014   Name: Sherry Weber MRN: [id number]   DOB: Apr 15, 1966  REFERRING PHYSICIAN: [name], MD  DIAGNOSIS: DCIS of the right breast  HISTORY OF PRESENT ILLNESS::Sherry Weber is a 48 y.o. female  Who palpated a mass in the right upper outer quadrant.  She sought evaluation and a mammogram showed an 8 mm mas in the upper outer quadrant with adjacent calcifications. A stereotactic biopsy showed ADH. Surgical excision was recommended and she underwent a lumpectomy on 06/30/14.  I do not see the post procedure mammogram report. The pathology showed a 1.5 cm area of low grade DCIS. Margins were negative. Estrogen was 98% and progesterone was 82% positive. She has done well from surgery. She has some hardness at her incision site and she has some strains and pulling over her right side. She presents today for my opinion regarding radiation in the management of her disease  PREVIOUS RADIATION THERAPY: No  PAST MEDICAL HISTORY:  has a past medical history of Medical history non-contributory.    PAST SURGICAL HISTORY: Past Surgical History  Procedure Laterality Date  . Dilation and curettage of uterus  2007  . Breast lumpectomy with needle localization Right 06/27/2014    Procedure: RIGHT BREAST LUMPECTOMY WITH NEEDLE LOCALIZATION;  Surgeon: [name], MD;  Location: [facility];  Service: General;  Laterality: Right;    FAMILY HISTORY: She had a paternal aunt with breast cancer in her late 55s treated with lumpectomy and radiation.  SOCIAL HISTORY:  History  Substance Use Topics  . Smoking status: Never Smoker   . Smokeless tobacco: Not on file  . Alcohol Use: No     Comment: alcohol causes rash-n/v    ALLERGIES: Review of patient's allergies indicates no known allergies.  MEDICATIONS:  Current Outpatient Prescriptions  Medication  Sig Dispense Refill  . acetaminophen (TYLENOL) 325 MG tablet Take 650 mg by mouth every 6 (six) hours as needed.       No current facility-administered medications for this encounter.    REVIEW OF SYSTEMS:  A 15 point review of systems is documented in the electronic medical record. This was obtained by the nursing staff. However, I reviewed this with the patient to discuss relevant findings and make appropriate changes.  Pertinent items are noted in HPI.  PHYSICAL EXAM:  Filed Vitals:   07/31/14 0858  BP: 114/68  Pulse: 57  Temp: 98.2 F (36.8 C)  .128 lb 1.6 oz (58.106 kg). Firm area over scar in the right axilla. No palpable abnormalities of the left or right breast. No palpable adenopathy.   LABORATORY DATA:  Lab Results  Component Value Date   WBC 4.7 06/25/2014   HGB 13.8 06/25/2014   HCT 40.9 06/25/2014   MCV 93.0 06/25/2014   PLT 295 06/25/2014   Lab Results  Component Value Date   NA 140 06/25/2014   K 3.9 06/25/2014   CL 101 06/25/2014   CO2 28 06/25/2014   Lab Results  Component Value Date   ALT 29 06/25/2014   AST 25 06/25/2014   ALKPHOS 46 06/25/2014   BILITOT 0.4 06/25/2014     RADIOGRAPHY: No results found.    IMPRESSION: DCIS of the right breast s/p lumpectomy  PLAN:I spoke to the patient today regarding her diagnosis and options for treatment.  We discussed the role of radiation in decreasing local failures in  patients who undergo lumpectomy. We discussed the process of simulation and the placement tattoos. We discussed 4 weeks of treatment as an outpatient. We discussed the possibility of asymptomatic lung damage. We discussed the low likelihood of secondary malignancies. We discussed the possible side effects including but not limited to skin redness, fatigue, permanent skin darkening, and breast swelling.    I have referred her to [facility] class for arm exercises. We sent her up to the [facility] to receive a [facility] and bag. She will see Dr. Lindi Adie for  discussion of antiestrogen therapy next week  I spent 40 minutes  face to face with the patient and more than 50% of that time was spent in counseling and/or coordination of care.   ------------------------------------------------  Thea Silversmith, MD

## 2014-07-31 NOTE — Progress Notes (Signed)
Please see the Nurse Progress Note in the MD Initial Consult Encounter for this patient. 

## 2014-07-31 NOTE — Addendum Note (Signed)
Encounter addended by: Arlyss Repress, RN on: 07/31/2014 10:41 AM<BR>     Documentation filed: Charges VN

## 2014-08-05 ENCOUNTER — Ambulatory Visit
Admission: RE | Admit: 2014-08-05 | Discharge: 2014-08-05 | Disposition: A | Discharge status: Home / Self Care | Payer: Private Health Insurance - Indemnity | Source: Ambulatory Visit | Attending: Radiation Oncology | Admitting: Radiation Oncology

## 2014-08-05 DIAGNOSIS — Z510 Encounter for antineoplastic radiation therapy: Secondary | ICD-10-CM | POA: Diagnosis not present

## 2014-08-05 DIAGNOSIS — D0511 Intraductal carcinoma in situ of right breast: Secondary | ICD-10-CM

## 2014-08-05 NOTE — Progress Notes (Addendum)
Name: Marshae Azam   MRN: [id number]  Date:  08/05/2014  DOB: Dec 21, 1965  Status:outpatient    DIAGNOSIS: Breast cancer.  CONSENT VERIFIED: yes   SET UP: Patient is setup supine   IMMOBILIZATION:  The following immobilization was used:Custom Moldable Pillow, breast board.   NARRATIVE: Ms. Cannedy was brought to the [facility].  Identity was confirmed.  All relevant records and images related to the planned course of therapy were reviewed.  Then, the patient was positioned in a stable reproducible clinical set-up for radiation therapy.  Wires were placed to delineate the clinical extent of breast tissue. A wire was placed on the scar as well.  CT images were obtained.  An isocenter was placed. Skin markings were placed.  The CT images were loaded into the planning software where the target and avoidance structures were contoured.  The radiation prescription was entered and confirmed. The patient was discharged in stable condition and tolerated simulation well.    TREATMENT PLANNING NOTE:  Treatment planning then occurred. I have requested : MLC's, isodose plan, basic dose calculation  I personally designed and supervised the construction of 3 medically necessary complex treatment devices for the protection of critical normal structures including the lungs and contralateral breast as well as the immobilization device which is necessary for set up certainty.   3D simulation was performed. I have requested an analyzed a DVH of the heart, lungs and lumpectomy cavity.

## 2014-08-07 DIAGNOSIS — Z510 Encounter for antineoplastic radiation therapy: Secondary | ICD-10-CM | POA: Diagnosis not present

## 2014-08-08 ENCOUNTER — Telehealth: Payer: Self-pay

## 2014-08-08 NOTE — Telephone Encounter (Signed)
Medical records received by fax from [facility].  Copy to Dr. Lindi Adie.  Original to scan.

## 2014-08-12 DIAGNOSIS — Z510 Encounter for antineoplastic radiation therapy: Secondary | ICD-10-CM | POA: Diagnosis not present

## 2014-08-13 ENCOUNTER — Telehealth: Payer: Self-pay | Admitting: Hematology and Oncology

## 2014-08-13 ENCOUNTER — Ambulatory Visit (HOSPITAL_BASED_OUTPATIENT_CLINIC_OR_DEPARTMENT_OTHER): Payer: Private Health Insurance - Indemnity | Admitting: Hematology and Oncology

## 2014-08-13 ENCOUNTER — Ambulatory Visit
Admission: RE | Admit: 2014-08-13 | Discharge: 2014-08-13 | Disposition: A | Discharge status: Home / Self Care | Payer: Private Health Insurance - Indemnity | Source: Ambulatory Visit | Attending: Radiation Oncology | Admitting: Radiation Oncology

## 2014-08-13 ENCOUNTER — Encounter: Payer: Self-pay | Admitting: Hematology and Oncology

## 2014-08-13 ENCOUNTER — Ambulatory Visit: Payer: Private Health Insurance - Indemnity

## 2014-08-13 VITALS — BP 102/67 | HR 67 | Temp 97.7°F | Resp 20 | Ht 62.0 in | Wt 127.9 lb

## 2014-08-13 DIAGNOSIS — D0511 Intraductal carcinoma in situ of right breast: Secondary | ICD-10-CM

## 2014-08-13 DIAGNOSIS — Z170 Estrogen receptor positive status [ER+]: Secondary | ICD-10-CM

## 2014-08-13 DIAGNOSIS — Z510 Encounter for antineoplastic radiation therapy: Secondary | ICD-10-CM | POA: Diagnosis not present

## 2014-08-13 DIAGNOSIS — D0590 Unspecified type of carcinoma in situ of unspecified breast: Secondary | ICD-10-CM

## 2014-08-13 MED ORDER — TAMOXIFEN CITRATE 20 MG PO TABS: 20.0000 mg | ORAL_TABLET | Freq: Every day | ORAL | Status: DC

## 2014-08-13 NOTE — Progress Notes (Signed)
Checked in new pt with no financial concerns at this time.  I informed pt of the [employer] and gave her a flyer on the services they provide as well as Raquel's card if she wants to apply for the grant.  I also informed her of the different foundations that offer copay assistance for chemo if needed and if her insurance requires authorization for chemo we will obtain that for her.

## 2014-08-13 NOTE — Assessment & Plan Note (Signed)
1. Low-grade DCIS involving the right breast: ER/PR positive status post right breast lumpectomy. Initially she had a diagnosis of atypical ductal hyperplasia but on final pathology came back as low grade DCIS. She is scheduled to start radiation therapy tomorrow. She is here today to discuss adjuvant antiestrogen therapy.  2. I discussed the pathology report in great detail including the significance of ER and PR and implications on treatment. Patient understands that antiestrogen therapy decrease the risk of invasive breast cancer and decrease risk of in situ breast cancer but he does not prolong survival.  3. We discussed the risks and benefits of tamoxifen. These include but not limited to insomnia, hot flashes, mood changes, vaginal dryness, and weight gain. Although rare, serious side effects including endometrial cancer, risk of blood clots were also discussed. We strongly believe that the benefits far outweigh the risks. Patient understands these risks and consented to starting treatment. Planned treatment duration is 5 years.  4. Patient will stop tamoxifen once radiation therapy is complete. [facility] an electronic [facility]. Patient to return back to see [place] in 3 months for followup.

## 2014-08-13 NOTE — Telephone Encounter (Signed)
, °

## 2014-08-13 NOTE — Progress Notes (Signed)
  Radiation Oncology         (336) [phone] ________________________________  Name: Sherry Weber MRN: [id number]  Date: 08/13/2014  DOB: 04/06/1966  Simulation Verification Note  Status: outpatient  NARRATIVE: The patient was brought to the treatment unit and placed in the planned treatment position. The clinical setup was verified. Then port films were obtained and uploaded to the radiation oncology medical record software.  The treatment beams were carefully compared against the planned radiation fields. The position location and shape of the radiation fields was reviewed. The targeted volume of tissue appears appropriately covered by the radiation beams. Organs at risk appear to be excluded as planned.  Based on my personal review, I approved the simulation verification. The patient's treatment will proceed as planned.  ------------------------------------------------  Thea Silversmith, MD

## 2014-08-13 NOTE — Progress Notes (Signed)
[facility] CONSULT NOTE  Patient Care Team: Jonathon Bellows, MD as PCP - General (Family Medicine)  CHIEF COMPLAINTS/PURPOSE OF CONSULTATION:  Newly diagnosed breast cancer  HISTORY OF PRESENTING ILLNESS:  Sherry Weber 48 y.o. female is here because of recent diagnosis of right breast low-grade DCIS with calcifications. She had her annual mammogram in 2015 which revealed abnormal calcifications that led to a biopsy. Prior to this she had felt a slight abnormality in the lateral aspect of the breast. She felt it was much tighter in that area. Initial biopsy done revealed atypical ductal hyperplasia. She was then referred for surgery and she underwent right breast lumpectomy on 06/27/2014 that revealed low-grade DCIS 1.5 cm, ER 98% positive PR 82% positive. She was seen by radiation oncology and she will start radiation therapy tomorrow. She has been referred to [place] for discussion regarding antiestrogen therapy.  I reviewed her records extensively and collaborated the history with the patient.  In terms of breast cancer risk profile:  She menarched at early age of 89   She had one pregnancy, that was miscarried She has received birth control pills for approximately 3 years.  She was never exposed to fertility medications or hormone replacement therapy.  She has family history of Breast/GYN/GI cancer  MEDICAL HISTORY:  Past Medical History  Diagnosis Date  . Medical history non-contributory   . Breast cancer   . Miscarriage 2003    SURGICAL HISTORY: Past Surgical History  Procedure Laterality Date  . Dilation and curettage of uterus  2007  . Breast lumpectomy with needle localization Right 06/27/2014    Procedure: RIGHT BREAST LUMPECTOMY WITH NEEDLE LOCALIZATION;  Surgeon: [name], MD;  Location: [facility];  Service: General;  Laterality: Right;    SOCIAL HISTORY: Patient is an excellent artist as needed amazing landscapes. History   Social  History  . Marital Status: Married    Spouse Name: N/A    Number of Children: 0  . Years of Education: N/A   Occupational History  . Not on file.   Social History Main Topics  . Smoking status: Never Smoker   . Smokeless tobacco: Never Used  . Alcohol Use: No     Comment: alcohol causes rash-n/v  . Drug Use: No  . Sexual Activity: Yes   Other Topics Concern  . Not on file   Social History Narrative  . No narrative on file    FAMILY HISTORY: Family History  Problem Relation Age of Onset  . Cancer Paternal Aunt 36    breast cancer  . Cancer Mother     ALLERGIES:  has No Known Allergies.  MEDICATIONS:  Current Outpatient Prescriptions  Medication Sig Dispense Refill  . acetaminophen (TYLENOL) 325 MG tablet Take 650 mg by mouth every 6 (six) hours as needed.      . tamoxifen (NOLVADEX) 20 MG tablet Take 1 tablet (20 mg total) by mouth daily.  90 tablet  3   No current facility-administered medications for this visit.    REVIEW OF SYSTEMS:   Constitutional: Denies fevers, chills or abnormal night sweats Eyes: Denies blurriness of vision, double vision or watery eyes Ears, nose, mouth, throat, and face: Denies mucositis or sore throat Respiratory: Denies cough, dyspnea or wheezes Cardiovascular: Denies palpitation, chest discomfort or lower extremity swelling Gastrointestinal:  Denies nausea, heartburn or change in bowel habits Skin: Denies abnormal skin rashes Lymphatics: Denies new lymphadenopathy or easy bruising Neurological:Denies numbness, tingling or new weaknesses  Behavioral/Psych: Mood is stable, no new changes  Breast:  Recovering from lumpectomy in the right breast All other systems were reviewed with the patient and are negative.  PHYSICAL EXAMINATION: ECOG PERFORMANCE STATUS: 0 - Asymptomatic  Filed Vitals:   08/13/14 1434  BP: 102/67  Pulse: 67  Temp: 97.7 F (36.5 C)  Resp: 20   Filed Weights   08/13/14 1434  Weight: 127 lb 14.4 oz  (58.015 kg)    GENERAL:alert, no distress and comfortable SKIN: skin color, texture, turgor are normal, no rashes or significant lesions EYES: normal, conjunctiva are pink and non-injected, sclera clear OROPHARYNX:no exudate, no erythema and lips, buccal mucosa, and tongue normal  NECK: supple, thyroid normal size, non-tender, without nodularity LYMPH:  no palpable lymphadenopathy in the cervical, axillary or inguinal LUNGS: clear to auscultation and percussion with normal breathing effort HEART: regular rate & rhythm and no murmurs and no lower extremity edema ABDOMEN:abdomen soft, non-tender and normal bowel sounds Musculoskeletal:no cyanosis of digits and no clubbing  PSYCH: alert & oriented x 3 with fluent speech NEURO: no focal motor/sensory deficits  LABORATORY DATA:  I have reviewed the data as listed Lab Results  Component Value Date   WBC 4.7 06/25/2014   HGB 13.8 06/25/2014   HCT 40.9 06/25/2014   MCV 93.0 06/25/2014   PLT 295 06/25/2014   Lab Results  Component Value Date   NA 140 06/25/2014   K 3.9 06/25/2014   CL 101 06/25/2014   CO2 28 06/25/2014    RADIOGRAPHIC STUDIES: I have personally reviewed the radiological reports and agreed with the findings in the report.  ASSESSMENT AND PLAN:  Ductal carcinoma in situ (DCIS) of right breast 1. Low-grade DCIS involving the right breast: ER/PR positive status post right breast lumpectomy. Initially she had a diagnosis of atypical ductal hyperplasia but on final pathology came back as low grade DCIS. She is scheduled to start radiation therapy tomorrow. She is here today to discuss adjuvant antiestrogen therapy.  2. I discussed the pathology report in great detail including the significance of ER and PR and implications on treatment. Patient understands that antiestrogen therapy decrease the risk of invasive breast cancer and decrease risk of in situ breast cancer but he does not prolong survival.  3. We discussed the risks and  benefits of tamoxifen. These include but not limited to insomnia, hot flashes, mood changes, vaginal dryness, and weight gain. Although rare, serious side effects including endometrial cancer, risk of blood clots were also discussed. We strongly believe that the benefits far outweigh the risks. Patient understands these risks and consented to starting treatment. Planned treatment duration is 5 years.  4. Patient will stop tamoxifen once radiation therapy is complete. [facility]. Patient to return back to see [place] in 3 months for followup.    Patient does yoga every day which has been helping her heel her back and other issues. She eats very healthy exercises regularly. I encouraged her to continue with these activities as they may assist with improving the hot flashes related to tamoxifen.  All questions were answered. The patient knows to call the clinic with any problems, questions or concerns. I spent 55 minutes counseling the patient face to face. The total time spent in the appointment was 60 minutes and more than 50% was on counseling.     Rulon Eisenmenger, MD 08/13/2014 3:39 PM

## 2014-08-14 ENCOUNTER — Ambulatory Visit
Admission: RE | Admit: 2014-08-14 | Discharge: 2014-08-14 | Disposition: A | Discharge status: Home / Self Care | Payer: Private Health Insurance - Indemnity | Source: Ambulatory Visit | Attending: Radiation Oncology | Admitting: Radiation Oncology

## 2014-08-14 DIAGNOSIS — Z510 Encounter for antineoplastic radiation therapy: Secondary | ICD-10-CM | POA: Diagnosis not present

## 2014-08-14 DIAGNOSIS — D0511 Intraductal carcinoma in situ of right breast: Secondary | ICD-10-CM

## 2014-08-14 MED ORDER — ALRA NON-METALLIC DEODORANT (RAD-ONC)
1.0000 "application " | Freq: Once | TOPICAL | Status: AC
  Administered 2014-08-14: 1 via TOPICAL

## 2014-08-14 MED ORDER — RADIAPLEXRX EX GEL
Freq: Once | CUTANEOUS | Status: AC
  Administered 2014-08-14: 11:00:00 via TOPICAL

## 2014-08-15 ENCOUNTER — Ambulatory Visit
Admission: RE | Admit: 2014-08-15 | Discharge: 2014-08-15 | Disposition: A | Discharge status: Home / Self Care | Payer: Private Health Insurance - Indemnity | Source: Ambulatory Visit | Attending: Radiation Oncology | Admitting: Radiation Oncology

## 2014-08-15 DIAGNOSIS — Z510 Encounter for antineoplastic radiation therapy: Secondary | ICD-10-CM | POA: Diagnosis not present

## 2014-08-15 NOTE — Progress Notes (Signed)
Routine of clinic reviewed.Weekly doctor assessment on Tuesday after treatment.Given Radiation Therapy and You Booklet, skin care sheet, alra deodorant and radiaplex gel.Utilized teach back method to discuss side effects and patient understanding of what to expect regarding skin changes, fatigue, tenderness and swelling and when to apply radiaplex.

## 2014-08-18 ENCOUNTER — Ambulatory Visit
Admission: RE | Admit: 2014-08-18 | Discharge: 2014-08-18 | Disposition: A | Discharge status: Home / Self Care | Payer: Private Health Insurance - Indemnity | Source: Ambulatory Visit | Attending: Radiation Oncology | Admitting: Radiation Oncology

## 2014-08-18 DIAGNOSIS — Z510 Encounter for antineoplastic radiation therapy: Secondary | ICD-10-CM | POA: Diagnosis not present

## 2014-08-19 ENCOUNTER — Ambulatory Visit
Admission: RE | Admit: 2014-08-19 | Discharge: 2014-08-19 | Disposition: A | Discharge status: Home / Self Care | Payer: Private Health Insurance - Indemnity | Source: Ambulatory Visit | Attending: Radiation Oncology | Admitting: Radiation Oncology

## 2014-08-19 VITALS — BP 96/65 | HR 52 | Temp 98.0°F | Wt 124.8 lb

## 2014-08-19 DIAGNOSIS — D0511 Intraductal carcinoma in situ of right breast: Secondary | ICD-10-CM

## 2014-08-19 DIAGNOSIS — Z510 Encounter for antineoplastic radiation therapy: Secondary | ICD-10-CM | POA: Diagnosis not present

## 2014-08-19 NOTE — Progress Notes (Signed)
Patient for weekly assessment of radiation to right breast.Completed 4 of 16 treatments.Denies pain.No skin changes or questions.Using radiaplex.

## 2014-08-19 NOTE — Progress Notes (Signed)
Weekly Management Note Current Dose:10.68   Gy  Projected Dose: 52.72 Gy   Narrative:  The patient presents for routine under treatment assessment.  CBCT/MVCT images/Port film x-rays were reviewed.  The chart was checked. Doing ok. No complaints.   Physical Findings: Weight: 124 lb 12.8 oz (56.609 kg). Unchanged. slighty dark skin on the right breast.  Impression:  The patient is tolerating radiation.  Plan:  Continue treatment as planned. Continue radiaplex.

## 2014-08-20 ENCOUNTER — Ambulatory Visit
Admission: RE | Admit: 2014-08-20 | Discharge: 2014-08-20 | Disposition: A | Discharge status: Home / Self Care | Payer: Private Health Insurance - Indemnity | Source: Ambulatory Visit | Attending: Radiation Oncology | Admitting: Radiation Oncology

## 2014-08-20 DIAGNOSIS — Z510 Encounter for antineoplastic radiation therapy: Secondary | ICD-10-CM | POA: Diagnosis not present

## 2014-08-21 ENCOUNTER — Ambulatory Visit
Admission: RE | Admit: 2014-08-21 | Discharge: 2014-08-21 | Disposition: A | Discharge status: Home / Self Care | Payer: Private Health Insurance - Indemnity | Source: Ambulatory Visit | Attending: Radiation Oncology | Admitting: Radiation Oncology

## 2014-08-21 DIAGNOSIS — Z510 Encounter for antineoplastic radiation therapy: Secondary | ICD-10-CM | POA: Diagnosis not present

## 2014-08-22 ENCOUNTER — Ambulatory Visit
Admission: RE | Admit: 2014-08-22 | Discharge: 2014-08-22 | Disposition: A | Discharge status: Home / Self Care | Payer: Private Health Insurance - Indemnity | Source: Ambulatory Visit | Attending: Radiation Oncology | Admitting: Radiation Oncology

## 2014-08-22 DIAGNOSIS — Z510 Encounter for antineoplastic radiation therapy: Secondary | ICD-10-CM | POA: Diagnosis not present

## 2014-08-25 ENCOUNTER — Ambulatory Visit
Admission: RE | Admit: 2014-08-25 | Discharge: 2014-08-25 | Disposition: A | Discharge status: Home / Self Care | Payer: Private Health Insurance - Indemnity | Source: Ambulatory Visit | Attending: Radiation Oncology | Admitting: Radiation Oncology

## 2014-08-25 DIAGNOSIS — Z510 Encounter for antineoplastic radiation therapy: Secondary | ICD-10-CM | POA: Diagnosis not present

## 2014-08-26 ENCOUNTER — Ambulatory Visit
Admission: RE | Admit: 2014-08-26 | Discharge: 2014-08-26 | Disposition: A | Discharge status: Home / Self Care | Payer: Private Health Insurance - Indemnity | Source: Ambulatory Visit | Attending: Radiation Oncology | Admitting: Radiation Oncology

## 2014-08-26 VITALS — BP 108/60 | HR 48 | Temp 97.9°F | Wt 122.5 lb

## 2014-08-26 DIAGNOSIS — D0511 Intraductal carcinoma in situ of right breast: Secondary | ICD-10-CM

## 2014-08-26 DIAGNOSIS — Z510 Encounter for antineoplastic radiation therapy: Secondary | ICD-10-CM | POA: Diagnosis not present

## 2014-08-26 NOTE — Progress Notes (Signed)
Weekly Management Note Current Dose: 24.03  Gy  Projected Dose: 52.72 Gy   Narrative:  The patient presents for routine under treatment assessment.  CBCT/MVCT images/Port film x-rays were reviewed.  The chart was checked. Doing well. Some itching and a few "bumps" on her breast. Otherwise feeling well.   Physical Findings: Weight: 122 lb 8 oz (55.566 kg). Unchanged. Slightly dark with 2 pimple appearing lesions over the breast.  Impression:  The patient is tolerating radiation.  Plan:  Continue treatment as planned. Continue radiaplex.

## 2014-08-26 NOTE — Progress Notes (Signed)
Completed sday 9 of radiation to right breast.Mild tanning of skin with a few itching bumps.No pain or excessive fatigue.continue with radiaplex.

## 2014-08-27 ENCOUNTER — Ambulatory Visit
Admission: RE | Admit: 2014-08-27 | Discharge: 2014-08-27 | Disposition: A | Discharge status: Home / Self Care | Payer: Private Health Insurance - Indemnity | Source: Ambulatory Visit | Attending: Radiation Oncology | Admitting: Radiation Oncology

## 2014-08-27 DIAGNOSIS — Z510 Encounter for antineoplastic radiation therapy: Secondary | ICD-10-CM | POA: Diagnosis not present

## 2014-08-28 ENCOUNTER — Ambulatory Visit
Admission: RE | Admit: 2014-08-28 | Discharge: 2014-08-28 | Disposition: A | Discharge status: Home / Self Care | Payer: Private Health Insurance - Indemnity | Source: Ambulatory Visit | Attending: Radiation Oncology | Admitting: Radiation Oncology

## 2014-08-28 DIAGNOSIS — Z510 Encounter for antineoplastic radiation therapy: Secondary | ICD-10-CM | POA: Diagnosis not present

## 2014-08-29 ENCOUNTER — Ambulatory Visit
Admission: RE | Admit: 2014-08-29 | Discharge: 2014-08-29 | Disposition: A | Discharge status: Home / Self Care | Payer: Private Health Insurance - Indemnity | Source: Ambulatory Visit | Attending: Radiation Oncology | Admitting: Radiation Oncology

## 2014-08-29 DIAGNOSIS — Z510 Encounter for antineoplastic radiation therapy: Secondary | ICD-10-CM | POA: Diagnosis not present

## 2014-09-01 ENCOUNTER — Ambulatory Visit
Admission: RE | Admit: 2014-09-01 | Discharge: 2014-09-01 | Disposition: A | Discharge status: Home / Self Care | Payer: Private Health Insurance - Indemnity | Source: Ambulatory Visit | Attending: Radiation Oncology | Admitting: Radiation Oncology

## 2014-09-01 DIAGNOSIS — Z510 Encounter for antineoplastic radiation therapy: Secondary | ICD-10-CM | POA: Diagnosis not present

## 2014-09-02 ENCOUNTER — Ambulatory Visit
Admission: RE | Admit: 2014-09-02 | Discharge: 2014-09-02 | Disposition: A | Discharge status: Home / Self Care | Payer: Private Health Insurance - Indemnity | Source: Ambulatory Visit | Attending: Radiation Oncology | Admitting: Radiation Oncology

## 2014-09-02 DIAGNOSIS — Z510 Encounter for antineoplastic radiation therapy: Secondary | ICD-10-CM | POA: Diagnosis not present

## 2014-09-02 DIAGNOSIS — D0511 Intraductal carcinoma in situ of right breast: Secondary | ICD-10-CM

## 2014-09-02 NOTE — Addendum Note (Signed)
Encounter addended by: Thea Silversmith, MD on: 09/02/2014 12:36 PM<BR>     Documentation filed: Notes Section

## 2014-09-02 NOTE — Progress Notes (Addendum)
Weekly Management Note Current Dose: 37.38  Gy  Projected Dose: 52.72 Gy   Narrative:  The patient presents for routine under treatment assessment.  CBCT/MVCT images/Port film x-rays were reviewed.  The chart was checked.  Doing well. Per patient, skin feels better. Using radiaplex. Saw on tx machine to verify electron mark out.   Physical Findings: Weight:  . Unchanged. Slightly dark skin on right breast  Impression:  The patient is tolerating radiation.  Plan:  Continue treatment as planned. Continue radiaplex.

## 2014-09-03 ENCOUNTER — Ambulatory Visit
Admission: RE | Admit: 2014-09-03 | Discharge: 2014-09-03 | Disposition: A | Discharge status: Home / Self Care | Payer: Private Health Insurance - Indemnity | Source: Ambulatory Visit | Attending: Radiation Oncology | Admitting: Radiation Oncology

## 2014-09-03 DIAGNOSIS — Z510 Encounter for antineoplastic radiation therapy: Secondary | ICD-10-CM | POA: Diagnosis not present

## 2014-09-04 ENCOUNTER — Ambulatory Visit
Admission: RE | Admit: 2014-09-04 | Discharge: 2014-09-04 | Disposition: A | Discharge status: Home / Self Care | Payer: Private Health Insurance - Indemnity | Source: Ambulatory Visit | Attending: Radiation Oncology | Admitting: Radiation Oncology

## 2014-09-04 DIAGNOSIS — Z510 Encounter for antineoplastic radiation therapy: Secondary | ICD-10-CM | POA: Diagnosis not present

## 2014-09-04 DIAGNOSIS — D0591 Unspecified type of carcinoma in situ of right breast: Secondary | ICD-10-CM | POA: Diagnosis not present

## 2014-09-05 ENCOUNTER — Ambulatory Visit
Admission: RE | Admit: 2014-09-05 | Discharge: 2014-09-05 | Disposition: A | Discharge status: Home / Self Care | Payer: Private Health Insurance - Indemnity | Source: Ambulatory Visit | Attending: Radiation Oncology | Admitting: Radiation Oncology

## 2014-09-05 DIAGNOSIS — Z510 Encounter for antineoplastic radiation therapy: Secondary | ICD-10-CM | POA: Diagnosis not present

## 2014-09-08 ENCOUNTER — Ambulatory Visit
Admission: RE | Admit: 2014-09-08 | Discharge: 2014-09-08 | Disposition: A | Discharge status: Home / Self Care | Payer: Private Health Insurance - Indemnity | Source: Ambulatory Visit | Attending: Radiation Oncology | Admitting: Radiation Oncology

## 2014-09-08 DIAGNOSIS — Z510 Encounter for antineoplastic radiation therapy: Secondary | ICD-10-CM | POA: Diagnosis not present

## 2014-09-09 ENCOUNTER — Ambulatory Visit
Admission: RE | Admit: 2014-09-09 | Discharge: 2014-09-09 | Disposition: A | Discharge status: Home / Self Care | Payer: Private Health Insurance - Indemnity | Source: Ambulatory Visit | Attending: Radiation Oncology | Admitting: Radiation Oncology

## 2014-09-09 VITALS — BP 106/61 | HR 55 | Temp 97.7°F | Wt 121.9 lb

## 2014-09-09 DIAGNOSIS — D0511 Intraductal carcinoma in situ of right breast: Secondary | ICD-10-CM

## 2014-09-09 DIAGNOSIS — Z510 Encounter for antineoplastic radiation therapy: Secondary | ICD-10-CM | POA: Diagnosis not present

## 2014-09-09 MED ORDER — BIAFINE EX EMUL
CUTANEOUS | Status: DC | PRN
  Administered 2014-09-09: 12:00:00 via TOPICAL

## 2014-09-09 NOTE — Progress Notes (Signed)
Weekly Management Note Current Dose: 48.72  Gy  Projected Dose: 52.72 Gy   Narrative:  The patient presents for routine under treatment assessment.  CBCT/MVCT images/Port film x-rays were reviewed.  The chart was checked. Doing well. Skin dark and irritated under arm which is uncomfortable. Tearful. Has tamoxifen and follow up appointment.   Physical Findings: Weight: 121 lb 14.4 oz (55.293 kg). Dry desquamation over right breast. Worse in axilla. No moist desquamation.   Impression:  The patient is tolerating radiation.  Plan:  Continue treatment as planned.discussed FYNN and gave info. Discussed use of vit e after skin heals. Follow up in 1 month.

## 2014-09-10 ENCOUNTER — Ambulatory Visit
Admission: RE | Admit: 2014-09-10 | Discharge: 2014-09-10 | Disposition: A | Discharge status: Home / Self Care | Payer: Private Health Insurance - Indemnity | Source: Ambulatory Visit | Attending: Radiation Oncology | Admitting: Radiation Oncology

## 2014-09-10 ENCOUNTER — Encounter: Payer: Self-pay | Admitting: Radiation Oncology

## 2014-09-10 DIAGNOSIS — Z510 Encounter for antineoplastic radiation therapy: Secondary | ICD-10-CM | POA: Diagnosis not present

## 2014-09-11 ENCOUNTER — Ambulatory Visit
Admission: RE | Admit: 2014-09-11 | Discharge: 2014-09-11 | Disposition: A | Discharge status: Home / Self Care | Payer: Private Health Insurance - Indemnity | Source: Ambulatory Visit | Attending: Radiation Oncology | Admitting: Radiation Oncology

## 2014-09-11 DIAGNOSIS — Z510 Encounter for antineoplastic radiation therapy: Secondary | ICD-10-CM | POA: Diagnosis not present

## 2014-09-15 NOTE — Progress Notes (Signed)
  Radiation Oncology         (336) [phone] ________________________________  Name: Sherry Weber MRN: [id number]  Date: 09/11/2014  DOB: Jan 15, 1966  End of Treatment Note  Diagnosis:   DCIS of the right breast     Indication for treatment:  Curative       Radiation treatment dates:   08/14/2014-09/11/2014  Site/dose:     Right breast/ 42.72 Gy at 2.67 Gy per fraction x 21 fractions.  Right breast boost/ 10 Gy at 2 Gy per fraction x 5 fractions  Beams/energy:  Opposed tangents with electronic compensation / 6 MV photons Enface electrons / 6 MeV electrons  Narrative: The patient tolerated radiation treatment relatively well.   She had minimal hyperpigmentation of the right breast.   Plan: The patient has completed radiation treatment. The patient will return to radiation oncology clinic for routine followup in one month. I advised them to call or return sooner if they have any questions or concerns related to their recovery or treatment.  ------------------------------------------------  Thea Silversmith, MD

## 2014-10-07 ENCOUNTER — Encounter: Payer: Self-pay | Admitting: Hematology and Oncology

## 2014-10-11 NOTE — Progress Notes (Signed)
Radiation Oncology         (336) [phone] ________________________________  Name: Sherry Weber      MRN: [id number]          Date: 08/05/14              DOB: [birth year]  Optical Surface Tracking Plan:  Since intensity modulated radiotherapy (IMRT) and 3D conformal radiation treatment methods are predicated on accurate and precise positioning for treatment, intrafraction motion monitoring is medically necessary to ensure accurate and safe treatment delivery.  The ability to quantify intrafraction motion without excessive ionizing radiation dose can only be performed with optical surface tracking. Accordingly, surface imaging offers the opportunity to obtain 3D measurements of patient position throughout IMRT and 3D treatments without excessive radiation exposure.  I am ordering optical surface tracking for this patient's upcoming course of radiotherapy. ________________________________ Signature   Reference:   Ursula Alert, J, et al. Surface imaging-based analysis of intrafraction motion for breast radiotherapy patients.Journal of [place], n. 6, nov. 2014. ISSN 54492010.   Available at: <http://www.jacmp.org/index.php/jacmp/article/view/4957>.

## 2014-10-11 NOTE — Progress Notes (Signed)
Name: Sherry Weber   MRN: [id number]  Date:  9/25/156   DOB: 10/26/1966  Status:outpatient    DIAGNOSIS: Right breast cancer  CONSENT VERIFIED: yes   SET UP: Patient is setup supine   IMMOBILIZATION:  The following immobilization was used:Custom Moldable Pillow, breast board.   NARRATIVE: Sherry Weber underwent complex simulation and treatment planning for her boost treatment today.  Her tumor volume was outlined on the planning CT scan. The depth of her cavity was felt to be appropriate for treatment with electrons     6 MeV electrons will be prescribed to the 100% isodose line.   I personally oversaw and approved the construction of a unique block which will be used for beam modification purposes.  A special port plan is requested.

## 2014-10-16 ENCOUNTER — Ambulatory Visit
Admission: RE | Admit: 2014-10-16 | Discharge: 2014-10-16 | Disposition: A | Discharge status: Home / Self Care | Payer: Private Health Insurance - Indemnity | Source: Ambulatory Visit | Attending: Radiation Oncology | Admitting: Radiation Oncology

## 2014-10-16 ENCOUNTER — Encounter: Payer: Self-pay | Admitting: Radiation Oncology

## 2014-10-16 VITALS — BP 125/89 | HR 90 | Temp 98.3°F | Resp 14 | Wt 114.7 lb

## 2014-10-16 DIAGNOSIS — D0511 Intraductal carcinoma in situ of right breast: Secondary | ICD-10-CM

## 2014-10-16 HISTORY — DX: Reserved for inherently not codable concepts without codable children: IMO0001

## 2014-10-16 HISTORY — DX: Reserved for concepts with insufficient information to code with codable children: IMO0002

## 2014-10-16 NOTE — Progress Notes (Signed)
.  She is currently in no pain. Pt complains of, Loss of Sleep and Fatigue. Dog is ill, very upset today and crying reports she is not handling this well.  Her dog is like a family member.  Pt right breast skin warm dry and intact.

## 2014-10-17 ENCOUNTER — Encounter: Payer: Self-pay | Admitting: Radiation Oncology

## 2014-10-17 NOTE — Progress Notes (Signed)
   Department of Radiation Oncology  Phone:  [phone] Fax:        [phone]   Name: Sherry Weber MRN: [id number]  DOB: [birth year]  Date: 10/16/2014  Follow Up Visit Note  Diagnosis: Ductal carcinoma in situ (DCIS) of right breast   Staging form: Breast, AJCC 7th Edition     Pathologic: Stage Unknown (Tis (DCIS), NX, cM0) - Signed by Thea Silversmith, MD on 07/31/2014  Interval History: Sherry Weber presents today for routine followup.  Skin has healed well. Tamoxifen is causing no side effects. She is tearful and upset today as her dog is dying.   Physical Exam:  Filed Vitals:   10/16/14 1353  BP: 125/89  Pulse: 90  Temp: 98.3 F (36.8 C)  TempSrc: Oral  Resp: 14  Weight: 114 lb 11.2 oz (52.028 kg)  SpO2: 100%   Slight hyperpigmentation of the right breast. No moist desquamation. Tearful, sobbing.   IMPRESSION: Sherry Weber is a 48 y.o. female s/p breast conservation with resolving acute effects of treatment.   PLAN:  She was so upset about her dog, I encouraged her to go be with her pet and call back if she had questions. We discussed sun protection in the treated area. She is aware of her need for yearly mammograms and follow up with Dr. Lindi Adie.     Thea Silversmith, MD

## 2014-10-29 ENCOUNTER — Telehealth: Payer: Self-pay

## 2014-10-29 NOTE — Telephone Encounter (Signed)
Returned pt call re: plan.  Pt says she has rcvd ltr from insurance company saying she should see gynecologist for high risk of cervical cancer.  There is nothing in Dr Geralyn Flash office notes about needing gyn referral/high risk cervical cancer.  Advised pt she has appt on 12/7 at 1045 - requested patient bring copy of letter to appt.  Also confirmed patient had started tamoxifen.  Pt voiced understanding.

## 2014-11-10 ENCOUNTER — Ambulatory Visit (HOSPITAL_BASED_OUTPATIENT_CLINIC_OR_DEPARTMENT_OTHER): Payer: Private Health Insurance - Indemnity | Admitting: Hematology and Oncology

## 2014-11-10 ENCOUNTER — Telehealth: Payer: Self-pay | Admitting: Hematology and Oncology

## 2014-11-10 VITALS — BP 104/58 | HR 53 | Temp 98.1°F | Resp 18 | Ht 62.0 in | Wt 117.3 lb

## 2014-11-10 DIAGNOSIS — Z170 Estrogen receptor positive status [ER+]: Secondary | ICD-10-CM

## 2014-11-10 DIAGNOSIS — D0511 Intraductal carcinoma in situ of right breast: Secondary | ICD-10-CM

## 2014-11-10 NOTE — Progress Notes (Signed)
Patient Care Team: Jonathon Bellows, MD as PCP - General (Family Medicine)  DIAGNOSIS: Ductal carcinoma in situ (DCIS) of right breast   Staging form: Breast, AJCC 7th Edition     Pathologic: Stage Unknown (Tis (DCIS), NX, cM0) - Signed by Thea Silversmith, MD on 07/31/2014  Treatment summary: Right breast lumpectomy: Low-grade DCIS; status post radiation, currently on tamoxifen started October 2015  CHIEF COMPLIANT: followup of breast cancer on tamoxifen  INTERVAL HISTORY: Sherry Weber is a 48 year old Caucasian with above-mentioned history of right-sided DCIS status post lumpectomy and radiation, started tamoxifen October 2015 and is tolerating it extremely well without any major problems or concerns. She is very sad today because her dog is dying with congestive heart failure. She received a letter in the mail from her insurance company that she had cervical cancer screening primary care physician refer her to gynecology. Primary care physician also recommended that she undergo genetic counseling and testing.  REVIEW OF SYSTEMS:   Constitutional: Denies fevers, chills or abnormal weight loss Eyes: Denies blurriness of vision Ears, nose, mouth, throat, and face: Denies mucositis or sore throat Respiratory: Denies cough, dyspnea or wheezes Cardiovascular: Denies palpitation, chest discomfort or lower extremity swelling Gastrointestinal:  Denies nausea, heartburn or change in bowel habits Skin: Denies abnormal skin rashes Lymphatics: Denies new lymphadenopathy or easy bruising Neurological:Denies numbness, tingling or new weaknesses Behavioral/Psych: Mood is stable, no new changes  Breast:  denies any pain or lumps or nodules in either breasts All other systems were reviewed with the patient and are negative.  I have reviewed the past medical history, past surgical history, social history and family history with the patient and they are unchanged from previous note.  ALLERGIES:  has No Known  Allergies.  MEDICATIONS:  Current Outpatient Prescriptions  Medication Sig Dispense Refill  . acetaminophen (TYLENOL) 325 MG tablet Take 650 mg by mouth every 6 (six) hours as needed.    . non-metallic deodorant Jethro Poling) MISC Apply 1 application topically daily as needed.    . tamoxifen (NOLVADEX) 20 MG tablet Take 1 tablet (20 mg total) by mouth daily. 90 tablet 3   No current facility-administered medications for this visit.    PHYSICAL EXAMINATION: ECOG PERFORMANCE STATUS: 0 - Asymptomatic  Filed Vitals:   11/10/14 1059  BP: 104/58  Pulse: 53  Temp: 98.1 F (36.7 C)  Resp: 18   Filed Weights   11/10/14 1059  Weight: 117 lb 4.8 oz (53.207 kg)    GENERAL:alert, no distress and comfortable SKIN: skin color, texture, turgor are normal, no rashes or significant lesions EYES: normal, Conjunctiva are pink and non-injected, sclera clear OROPHARYNX:no exudate, no erythema and lips, buccal mucosa, and tongue normal  NECK: supple, thyroid normal size, non-tender, without nodularity LYMPH:  no palpable lymphadenopathy in the cervical, axillary or inguinal LUNGS: clear to auscultation and percussion with normal breathing effort HEART: regular rate & rhythm and no murmurs and no lower extremity edema ABDOMEN:abdomen soft, non-tender and normal bowel sounds Musculoskeletal:no cyanosis of digits and no clubbing  NEURO: alert & oriented x 3 with fluent speech, no focal motor/sensory deficits BREAST: No palpable masses or nodules in either right or left breasts. No palpable axillary supraclavicular or infraclavicular adenopathy no breast tenderness or nipple discharge.   LABORATORY DATA:  I have reviewed the data as listed   Chemistry      Component Value Date/Time   NA 140 06/25/2014 1500   K 3.9 06/25/2014 1500   CL 101 06/25/2014 1500  CO2 28 06/25/2014 1500   BUN 15 06/25/2014 1500   CREATININE 0.65 06/25/2014 1500      Component Value Date/Time   CALCIUM 8.8 06/25/2014  1500   ALKPHOS 46 06/25/2014 1500   AST 25 06/25/2014 1500   ALT 29 06/25/2014 1500   BILITOT 0.4 06/25/2014 1500       Lab Results  Component Value Date   WBC 4.7 06/25/2014   HGB 13.8 06/25/2014   HCT 40.9 06/25/2014   MCV 93.0 06/25/2014   PLT 295 06/25/2014   NEUTROABS 2.5 06/25/2014     RADIOGRAPHIC STUDIES: I have personally reviewed the radiology reports and agreed with their findings. No results found.   ASSESSMENT & PLAN:  Ductal carcinoma in situ (DCIS) of right breast Low-grade DCIS involving the right breast: ER/PR positive status post right breast lumpectomy and radiation therapy. Started tamoxifen therapy on Toxicities to tamoxifen: patient has no side effects of tamoxifen.  Severe stress: Her dog is dying and she is extremely sad and tearful and stressed out about it. She received a letter from her insurance company that she needs cervical cancer screening and she started to worry that rest of cervical cancer related. She is now worried about ovarian cancer risk and would like to see genetics counselor. I do not think she has risk factors for genetic testing but we will send her to [facility] for further evaluation. She has an appointment to see gynecologist for Pap test. I discussed with her about stress relaxation techniques including the dictation deep breathing etc.  Surveillance: Annual breast exams and mammograms Survivorship:Discussed the importance of physical exercise in decreasing the likelihood of breast cancer recurrence. Recommended 30 mins daily 6 days a week of either brisk walking or cycling or swimming. Encouraged patient to eat more fruits and vegetables and decrease red meat.  Patient does yoga which has been helping her.   Orders Placed This Encounter  Procedures  . MM Digital Diagnostic Bilat    Standing Status: Future     Number of Occurrences:      Standing Expiration Date: 11/10/2015    Order Specific Question:  Reason for Exam (SYMPTOM  OR  DIAGNOSIS REQUIRED)    Answer:  Right breast DCIS; annual follow up    Order Specific Question:  Is the patient pregnant?    Answer:  No    Order Specific Question:  Preferred imaging location?    Answer:  External     Comments:  [facility]  . Ambulatory referral to Social Work    Referral Priority:  Routine    Referral Type:  Consultation    Referral Reason:  Specialty Services Required    Number of Visits Requested:  1   The patient has a good understanding of the overall plan. she agrees with it. She will call with any problems that may develop before her next visit here.   Rulon Eisenmenger, MD 11/10/2014 11:58 AM

## 2014-11-10 NOTE — Telephone Encounter (Signed)
, °

## 2014-11-10 NOTE — Assessment & Plan Note (Addendum)
Low-grade DCIS involving the right breast: ER/PR positive status post right breast lumpectomy and radiation therapy. Started tamoxifen therapy on Toxicities to tamoxifen: patient has no side effects of tamoxifen.  Severe stress: Her dog is dying and she is extremely sad and tearful and stressed out about it. She received a letter from her insurance company that she needs cervical cancer screening and she started to worry that rest of cervical cancer related. She is now worried about ovarian cancer risk and would like to see genetics counselor. I do not think she has risk factors for genetic testing but we will send her to [facility] for further evaluation. She has an appointment to see gynecologist for Pap test. I discussed with her about stress relaxation techniques including the dictation deep breathing etc.  Surveillance: Annual breast exams and mammograms Survivorship:Discussed the importance of physical exercise in decreasing the likelihood of breast cancer recurrence. Recommended 30 mins daily 6 days a week of either brisk walking or cycling or swimming. Encouraged patient to eat more fruits and vegetables and decrease red meat.  Patient does yoga which has been helping her.

## 2014-11-11 ENCOUNTER — Encounter: Payer: Self-pay | Admitting: *Deleted

## 2014-11-11 NOTE — Progress Notes (Signed)
[facility] Psychosocial Distress Screening Clinical Social Work  Clinical Social Work was referred by distress screening protocol.  The patient scored a 10 on the Psychosocial Distress Thermometer which indicates severe distress. Clinical Social Worker contacted patient at home to assess for distress and other psychosocial needs.  Patient stated she was doing well "medically" and felt comfortable with all her treatments.  Patient contributed most of her emotional distress to her fear of being at risk for additional cancer diagnosis or recurrence.  Patient also expressed emotional stress due to her dog being ill and possibility of it passing away in the near future.  CSW validated patients feelings and discussed common emotional reactions to completing treatment.  CSW and patient discussed support services available at [facility], and patient was agreeable to receiving information on the [facility] program.  CSW offered additional support and encouraged patient to call with needs or concerns.         ONCBCN DISTRESS SCREENING 11/10/2014  Screening Type Initial Screening  Distress experienced in past week (1-10) 10  Family Problem type Other (comment)  Emotional problem type Nervousness/Anxiety;Adjusting to illness;Feeling hopeless  Physical Problem type Pain  Referral to clinical social work Yes  Other (No Data)   Johnnye Lana, MSW, LCSW, OSW-C Clinical Social Worker [facility] [phone]

## 2014-11-12 ENCOUNTER — Telehealth: Payer: Self-pay | Admitting: *Deleted

## 2014-11-12 NOTE — Telephone Encounter (Signed)
Received request from Bary Castilla that Dr. Lindi Adie requested a genetic appt.  Called pt and confirmed 11/20/14 genetic appt w/ her.

## 2014-11-13 ENCOUNTER — Other Ambulatory Visit: Payer: Self-pay | Admitting: Nurse Practitioner

## 2014-11-13 ENCOUNTER — Other Ambulatory Visit (HOSPITAL_COMMUNITY)
Admission: RE | Admit: 2014-11-13 | Discharge: 2014-11-13 | Disposition: A | Discharge status: Home / Self Care | Payer: Private Health Insurance - Indemnity | Source: Ambulatory Visit | Attending: Nurse Practitioner | Admitting: Nurse Practitioner

## 2014-11-13 DIAGNOSIS — Z1151 Encounter for screening for human papillomavirus (HPV): Secondary | ICD-10-CM | POA: Diagnosis present

## 2014-11-13 DIAGNOSIS — Z01419 Encounter for gynecological examination (general) (routine) without abnormal findings: Secondary | ICD-10-CM | POA: Insufficient documentation

## 2014-11-14 LAB — CYTOLOGY - PAP

## 2014-11-20 ENCOUNTER — Ambulatory Visit (HOSPITAL_BASED_OUTPATIENT_CLINIC_OR_DEPARTMENT_OTHER): Payer: Private Health Insurance - Indemnity | Admitting: Genetic Counselor

## 2014-11-20 ENCOUNTER — Encounter: Payer: Self-pay | Admitting: Genetic Counselor

## 2014-11-20 ENCOUNTER — Other Ambulatory Visit: Payer: Private Health Insurance - Indemnity

## 2014-11-20 DIAGNOSIS — Z315 Encounter for genetic counseling: Secondary | ICD-10-CM

## 2014-11-20 DIAGNOSIS — D0511 Intraductal carcinoma in situ of right breast: Secondary | ICD-10-CM

## 2014-11-20 DIAGNOSIS — Z803 Family history of malignant neoplasm of breast: Secondary | ICD-10-CM

## 2014-11-20 NOTE — Progress Notes (Signed)
Patient Name: Sherry Weber Patient Age: 48 y.o. Encounter Date: 11/20/2014  Referring Physician: Nicholas Lose, MD  Primary Care Provider: Jonathon Bellows, MD   Sherry Weber, a 48 y.o. female, is being seen at the [facility] due to a personal and family history of breast cancer. She presents to clinic today with her friend, Gerald Stabs, to discuss the possibility of a hereditary predisposition to cancer and discuss whether genetic testing is warranted.  HISTORY OF PRESENT ILLNESS: Sherry Weber was diagnosed with right breast DCIS in 07/2014, at the age of 23. She is s/p lumpectomy and radiation and is receiving Tamoxifen.  The breast tumor was ER positive and PR positive.  [name] reports that she is being followed for a large uterine fibroid and her gynecologist recommended genetic testing to help figure out whether she has an increased risk of ovarian cancer which may then impact surgery.  Past Medical History  Diagnosis Date  . Medical history non-contributory   . Breast cancer   . Miscarriage 2003  . Radiation 08/14/14-09/11/14    Right Breast  . Family history of breast cancer     Past Surgical History  Procedure Laterality Date  . Dilation and curettage of uterus  2007  . Breast lumpectomy with needle localization Right 06/27/2014    Procedure: RIGHT BREAST LUMPECTOMY WITH NEEDLE LOCALIZATION;  Surgeon: [name], MD;  Location: [facility];  Service: General;  Laterality: Right;    History   Social History  . Marital Status: Married    Spouse Name: N/A    Number of Children: 0  . Years of Education: N/A   Social History Main Topics  . Smoking status: Never Smoker   . Smokeless tobacco: Never Used  . Alcohol Use: No     Comment: alcohol causes rash-n/v  . Drug Use: No  . Sexual Activity: Yes   Other Topics Concern  . Not on file   Social History Narrative     FAMILY HISTORY:   During the visit, a 4-generation  pedigree was obtained. Family tree will be sent for scanning and will be in EPIC under the Media tab.  Significant diagnoses include the following:  Family History  Problem Relation Age of Onset  . Cancer Paternal Aunt 38    breast cancer  . Cancer Father     Liver; Hepatitis; Currently 66  . Cancer Maternal Uncle 83    Kidney ca; currently 33    Additionally, Sherry Weber has no children. She has two sisters (age 2 and 87). Her mother is 54 and cancer-free. In addition to above, her father has another sister who is in her 33s and cancer-free.  Sherry Weber ancestry is [place]. There is no known Jewish ancestry and no consanguinity.  ASSESSMENT AND PLAN: Sherry Weber is a 48 y.o. female with a personal and family history of breast cancer. This history is not highly suggestive of a hereditary predisposition to cancer, but she has a relatively small family. She meets [employer]'s criteria for testing. We reviewed the characteristics, features and inheritance patterns of hereditary cancer syndromes. We also discussed genetic testing, including the process of testing, insurance coverage and implications of results. A negative result will be reassuring for her and her family.  Sherry Weber wished to pursue genetic testing and a blood sample will be sent to [facility] for analysis of the 17 genes on the BreastNext gene panel (ATM, BARD1, BRCA1, BRCA2, BRIP1, CDH1, CHEK2, MRE11A, MUTYH,  NBN, NF1, PALB2, PTEN, RAD50, RAD51C, RAD51D, and TP53). We discussed the implications of a positive, negative and/ or Variant of Uncertain Significance (VUS) result. Results should be available in approximately 4-5 weeks, at which point we will contact her and address implications for her as well as address genetic testing for at-risk family members, if needed.    We encouraged [name] to remain in contact with Cancer Genetics annually so that we can update the family history and inform her of any changes in  cancer genetics and testing that may be of benefit for this family. Ms.  Racey questions were answered to her satisfaction today.   Thank you for the referral and allowing Sherry Weber to share in the care of your patient.   The patient was seen for a total of 40 minutes, greater than 50% of which was spent face-to-face counseling. This patient was discussed with the overseeing provider who agrees with the above.   Steele Berg, MS, [place] Certified Genetic Counselor phone: [phone] [email or web address]

## 2014-12-05 HISTORY — PX: BREAST EXCISIONAL BIOPSY: SUR124

## 2014-12-17 ENCOUNTER — Encounter: Payer: Self-pay | Admitting: Genetic Counselor

## 2014-12-17 DIAGNOSIS — Z1379 Encounter for other screening for genetic and chromosomal anomalies: Secondary | ICD-10-CM | POA: Insufficient documentation

## 2014-12-17 NOTE — Progress Notes (Signed)
GENETIC TEST RESULTS  Patient Name: Sherry Weber Patient Age: 49 y.o. Encounter Date: 12/17/2014  Referring Physician: Nicholas Lose, MD   Sherry Weber was called today to discuss genetic test results. Please see the Genetics note from her visit on 11/20/14 for a detailed discussion of her personal and family history.  GENETIC TESTING: At the time of Sherry Weber's visit, we recommended she pursue genetic testing of multiple genes on the BreastNext gene panel. This test, which included sequencing and deletion/duplication analysis of 17 genes, was performed at [facility]. Testing was normal and did not reveal a mutation in these genes. The genes tested were ATM, BARD1, BRCA1, BRCA2, BRIP1, CDH1, CHEK2, MRE11A, MUTYH, NBN, NF1, PALB2, PTEN, RAD50, RAD51C, RAD51D, and TP53.  We discussed with Sherry Weber that since the current test is not perfect, it is possible there may be a gene mutation that current testing cannot detect, but that chance is small. We also discussed that it is possible that a different genetic factor, which was not part of this testing or has not yet been discovered, is responsible for the cancer diagnoses in the family. Given her family history, the likelihood of this is low. Should Sherry Weber wish to discuss or pursue this additional testing, we are happy to coordinate this at any time, but do not feel that she is at significant risk of harboring a mutation in a different gene.     CANCER SCREENING: This result suggests that Sherry Weber's cancer was most likely not due to an inherited predisposition. Most cancers happen by chance and this negative test, along with details of her family history, suggests that her cancer falls into this category. We, therefore, recommended she continue to follow the cancer screening guidelines provided by her physician.   FAMILY MEMBERS: Women in the family are at some increased risk of developing breast cancer, over the general  population risk, simply due to the family history. We recommended they have a yearly mammogram beginning 10 years earlier than the youngest diagnosed breast cancer, a yearly clinical breast exam, and perform monthly breast self-exams. A gynecologic exam is recommended yearly. Colon cancer screening is recommended to begin by age 26.  Lastly, we discussed with Sherry Weber that cancer genetics is a rapidly advancing field and it is possible that new genetic tests will be appropriate for her in the future. We encouraged her to remain in contact with [place] on an annual basis so we can update her personal and family histories, and let her know of advances in cancer genetics that may benefit the family. Our contact number was provided. Sherry Weber questions were answered to her satisfaction today, and she knows she is welcome to call anytime with additional questions.    Steele Berg, MS, [place] Certified Genetic Counselor phone: [phone] [email or web address]

## 2015-01-14 ENCOUNTER — Encounter (INDEPENDENT_AMBULATORY_CARE_PROVIDER_SITE_OTHER): Payer: Self-pay | Admitting: General Surgery

## 2015-01-14 NOTE — Progress Notes (Signed)
Patient ID: Sherry Weber, female   DOB: 05/29/1966, 49 y.o.   MRN: [id number]  Sherry Weber 01/14/2015 10:01 AM Location: [facility] Patient #: [id number] DOB: Nov 13, 1966 Married / Language: English / Race: Refused to Report/Unreported Female History of Present Illness Sherry Hollingshead MD; 01/14/2015 10:47 AM) Patient words: follow up right breast.  The patient is a 49 year old female    Note:Procedure: Right breast lumpectomy after wire localization  Date: 06/27/2014  Pathology: Right breast Low-grade ductal carcinoma in situ. Margins are clear. Estrogen receptor and progesterone receptor positive.  History: She is here for long-term follow-up visit. She is on tamoxifen. She is been under stress since her dog bite. She states she has intermittent upper chest tightness. No shortness of breath no nausea. The pain does not radiate. This seems to be around the time of the stressful situation and may be getting a little better. She explained that she may give a hysterectomy because of a fibroid tumors and some dysfunctional uterine bleeding, best I could understand.  Exam: General- Is in NAD. Right breast-a well-healed upper outer quadrant scar. No palpable masses.  Left breast-no palpable masses or suspicious skin changes.  Lymph nodes-no palpable cervical, supraclavicular, or axillary adenopathy.  CV-regular rate and rhythm  Lungs, clear to auscultation     Other Problems Elbert Ewings, CMA; 01/14/2015 10:01 AM) Breast Cancer  Past Surgical History Elbert Ewings, CMA; 01/14/2015 10:01 AM) Breast Biopsy Right. Breast Mass; Local Excision Right.  Diagnostic Studies History Elbert Ewings, [place]; 01/14/2015 10:01 AM) Colonoscopy never Mammogram within last year Pap Smear 1-5 years ago  Allergies Elbert Ewings, CMA; 01/14/2015 10:03 AM) No Known Drug Allergies 01/14/2015  Medication History Elbert Ewings, CMA; 01/14/2015 10:04 AM) Tylenol Cold  Multi-Symptom (5-10-200-325MG  Tablet, Oral as needed) Active.  Social History Elbert Ewings, [place]; 01/14/2015 10:01 AM) Alcohol use Occasional alcohol use. Caffeine use Coffee, Tea. No drug use Tobacco use Never smoker.  Family History Elbert Ewings, [place]; 01/14/2015 10:01 AM) Cancer Father. Diabetes Mellitus Father. Hypertension Father.  Pregnancy / Birth History Elbert Ewings, CMA; 01/14/2015 10:01 AM) Age at menarche 17 years. Gravida 1 Irregular periods Maternal age 60-35 Para 0     Review of Systems Elbert Ewings CMA; 01/14/2015 10:01 AM) General Not Present- Appetite Loss, Chills, Fatigue, Fever, Night Sweats, Weight Gain and Weight Loss. Skin Not Present- Change in Wart/Mole, Dryness, Hives, Jaundice, New Lesions, Non-Healing Wounds, Rash and Ulcer. HEENT Not Present- Earache, Hearing Loss, Hoarseness, Nose Bleed, Oral Ulcers, Ringing in the Ears, Seasonal Allergies, Sinus Pain, Sore Throat, Visual Disturbances, Wears glasses/contact lenses and Yellow Eyes. Respiratory Not Present- Bloody sputum, Chronic Cough, Difficulty Breathing, Snoring and Wheezing. Breast Not Present- Breast Mass, Breast Pain, Nipple Discharge and Skin Changes. Cardiovascular Present- Chest Pain. Not Present- Difficulty Breathing Lying Down, Leg Cramps, Palpitations, Rapid Heart Rate, Shortness of Breath and Swelling of Extremities. Gastrointestinal Not Present- Abdominal Pain, Bloating, Bloody Stool, Change in Bowel Habits, Chronic diarrhea, Constipation, Difficulty Swallowing, Excessive gas, Gets full quickly at meals, Hemorrhoids, Indigestion, Nausea, Rectal Pain and Vomiting. Female Genitourinary Not Present- Frequency, Nocturia, Painful Urination, Pelvic Pain and Urgency. Musculoskeletal Present- Back Pain. Not Present- Joint Pain, Joint Stiffness, Muscle Pain, Muscle Weakness and Swelling of Extremities. Neurological Not Present- Decreased Memory, Fainting, Headaches, Numbness, Seizures,  Tingling, Tremor, Trouble walking and Weakness. Psychiatric Not Present- Anxiety, Bipolar, Change in Sleep Pattern, Depression, Fearful and Frequent crying. Endocrine Not Present- Cold Intolerance, Excessive Hunger, Hair Changes, Heat Intolerance, Hot flashes and New Diabetes.  Hematology Not Present- Easy Bruising, Excessive bleeding, Gland problems, HIV and Persistent Infections.  Vitals Elbert Ewings CMA; 01/14/2015 10:05 AM) 01/14/2015 10:05 AM Weight: 116 lb Height: 63in Body Surface Area: 1.53 m Body Mass Index: 20.55 kg/m Temp.: 97.26F  Pulse: 66 (Regular)  Resp.: 18 (Unlabored)  BP: 112/66 (Sitting, Left Arm, Standard)     Assessment & Plan Sherry Hollingshead MD; 01/14/2015 10:47 AM)  DCIS (DUCTAL CARCINOMA IN SITU), RIGHT (233.0  D05.11) Impression: Assessment: Low-grade ductal carcinoma in situ of right breast status post lumpectomy-taking hormonal therapy. Having some chest tightness which may be stress related but I think this should be checked out. I discussed this with her.  Plan: I told her to call and get appointment with her primary care physician, Dr. Justin Mend, as soon as possible to have the chest tightness evaluated. Return visit with me in 6 months.  Current Plans Free Text Instructions : discussed with patient and provided information. Follow up in 6 months or as needed  Jackolyn Confer, MD

## 2015-01-20 ENCOUNTER — Other Ambulatory Visit: Payer: Self-pay | Admitting: Family Medicine

## 2015-01-20 ENCOUNTER — Ambulatory Visit
Admission: RE | Admit: 2015-01-20 | Discharge: 2015-01-20 | Disposition: A | Discharge status: Home / Self Care | Payer: Private Health Insurance - Indemnity | Source: Ambulatory Visit | Attending: Family Medicine | Admitting: Family Medicine

## 2015-01-20 DIAGNOSIS — R079 Chest pain, unspecified: Secondary | ICD-10-CM

## 2015-03-26 ENCOUNTER — Encounter (HOSPITAL_COMMUNITY)
Admission: RE | Admit: 2015-03-26 | Discharge: 2015-03-26 | Disposition: A | Discharge status: Home / Self Care | Payer: Managed Care, Other (non HMO) | Source: Ambulatory Visit | Attending: Obstetrics and Gynecology | Admitting: Obstetrics and Gynecology

## 2015-03-26 ENCOUNTER — Encounter (HOSPITAL_COMMUNITY): Payer: Self-pay

## 2015-03-26 DIAGNOSIS — Z01812 Encounter for preprocedural laboratory examination: Secondary | ICD-10-CM | POA: Insufficient documentation

## 2015-03-26 LAB — CBC
HCT: 41.2 % (ref 36.0–46.0)
Hemoglobin: 14.1 g/dL (ref 12.0–15.0)
MCH: 32.6 pg (ref 26.0–34.0)
MCHC: 34.2 g/dL (ref 30.0–36.0)
MCV: 95.2 fL (ref 78.0–100.0)
PLATELETS: 286 10*3/uL (ref 150–400)
RBC: 4.33 MIL/uL (ref 3.87–5.11)
RDW: 13.1 % (ref 11.5–15.5)
WBC: 5.6 10*3/uL (ref 4.0–10.5)

## 2015-03-26 LAB — BASIC METABOLIC PANEL
Anion gap: 9 (ref 5–15)
BUN: 24 mg/dL — ABNORMAL HIGH (ref 6–23)
CHLORIDE: 103 mmol/L (ref 96–112)
CO2: 28 mmol/L (ref 19–32)
CREATININE: 0.82 mg/dL (ref 0.50–1.10)
Calcium: 9.7 mg/dL (ref 8.4–10.5)
GFR calc non Af Amer: 83 mL/min — ABNORMAL LOW (ref >90)
Glucose, Bld: 98 mg/dL (ref 70–99)
POTASSIUM: 4.2 mmol/L (ref 3.5–5.1)
Sodium: 140 mmol/L (ref 135–145)

## 2015-03-26 LAB — TYPE AND SCREEN
ABO/RH(D): AB POS
ANTIBODY SCREEN: NEGATIVE

## 2015-03-26 LAB — ABO/RH: ABO/RH(D): AB POS

## 2015-03-26 NOTE — Patient Instructions (Signed)
Your procedure is scheduled on:04/01/15  Enter through the Main Entrance at :0830 am Pick up desk phone and dial [phone] and inform us of your arrival.  Please call [phone] if you have any problems the morning of surgery.  Remember: Do not eat food or drink liquids, including water, after midnight   You may brush your teeth the morning of surgery.  Take these meds the morning of surgery with a sip of water:Tamoxifen  DO NOT wear jewelry, eye make-up, lipstick,body lotion, or dark fingernail polish.  (Polished toes are ok) You may wear deodorant.  If you are to be admitted after surgery, leave suitcase in car until your room has been assigned. Patients discharged on the day of surgery will not be allowed to drive home. Wear loose fitting, comfortable clothes for your ride home.

## 2015-03-27 MED ORDER — BUPIVACAINE LIPOSOME 1.3 % IJ SUSP
20.0000 mL | Freq: Once | INTRAMUSCULAR | Status: AC
  Administered 2015-04-01: 20 mL
  Filled 2015-03-27: qty 20

## 2015-03-31 NOTE — H&P (Signed)
History of Present Illness  General:  49 y/o G1A1 presents for preop for TAH/bilateral salpingectomy/left oophrectomy due to fibroid uterus and complex ovarian cyst. Ultrasound shows uterus is 15 cm in length. Pt has 3 large posterior fibroids, 5-6 cm each. Uterus is not very mobile, fixed in pelvis. Pt states she feels the heaviness but denies pain. Pt has light menses. Left ovarian cyst ws 5 cm and 6 cm 1 month later. No pelvic fluid noted, thin septations without nodules or excrescences. CA-125 9.4 in 02/2015. Pt has a h/o breast cancer and is currently on Tamoxifen. She has been counseled on removing both ovaries. Her oncologist is in agreement but pt prefers to keep her right ovary. Pt has been counseled on risk of cancer in remaining ovary and estrogen production in remaining ovary. Tumor was ER+, PR+.   Current Medications  Taking   Tamoxifen Citrate 20 MG Tablet 1 tablet Once a day   Discontinued   Omeprazole 40 MG Capsule Delayed Release 1 capsule Once a day   Medication List reviewed and reconciled with the patient    Past Medical History  Right Breast Cancer: low grade DCIS, ER+ PR+, s/p lumpectomy 06/27/14, radiation, tamoxifen. Oncologist Dr. Kerry Hough Long  Fibroid  Infertility, G1P0010, Sab, s/p work-up and fertility treatments in [facility]   Surgical History  Right Breast lumpectomy due to breast cancer (DCIS) 06/2014   Family History  Father: alive 60 yrs, Liver Cancer, diagnosed with DM  Mother: alive 94 yrs, high cholesterol  Paternal Grand Father: deceased  Paternal Grand Mother: deceased  Maternal Grand Father: deceased  Maternal Grand Mother: deceased  Sister 1: alive 58 yrs, heart disease  Sister 2: alive 84 yrs, Healthy  Maternal aunt: alive, diagnsoed in her 36, diagnosed with Breast Ca  2 sister(s) .   denies any family history of gyn cancers.   Social History  General:  Tobacco use  cigarettes: Never smoked Tobacco history last updated  03/25/2015 no Tobacco Exposure.  no Alcohol.  Caffeine: yes, 2 cups coffee daily.  no Recreational drug use.  Exercise: yes, 5 days per week: Walking, Yoga, treadmill , Elliptical.  Education: Housewife.  Marital Status: married.  Children: none.  Seat belt use: yes.    Gyn History  Sexual activity currently sexually active.  Periods : irregular.  LMP 2 months ago, pt is taking Tamoxifen.  Birth control none.  Last pap smear date 11/13/2014-normal limits.  Last mammogram date 6.23.15 .  Denies H/O Abnormal pap smear.  Denies H/O STD.  Menarche 66.    OB History  Number of pregnancies 1.  miscarriages 1.  Pregnancy # 1 01/2002, miscarriage.    Allergies  N.K.D.A.   Hospitalization/Major Diagnostic Procedure  Miscarriage 2003   Review of Systems  Denies fever/chills, chest pain, SOB, headaches, numbness/tingling. No h/o complication with anesthesia, bleeding disorders or blood clots.   Vital Signs  Wt 112, Wt change -1 lb, Ht 62, BMI 20.48, Pulse sitting 63, BP sitting 96/61.   Physical Examination  GENERAL:  Patient appears alert and oriented.  General Appearance: well-appearing, well-developed, no acute distress.  Speech: clear.  LUNGS:  Auscultation: no wheezing/rhonchi/rales. CTA bilaterally.  HEART:  Heart sounds: normal. RRR. no murmur.  ABDOMEN:  General: Fibroid uterus palpated 2-3 FB below umbilicus, nontender, firm, nondistended.  FEMALE GENITOURINARY:  Pelvic Not examined.  EXTREMITIES:  General: No edema or calf tenderness.     Assessments   1. Pre-operative clearance - Z01.818 (Primary)  2. Complex cyst of left ovary - N83.29   3. Fibroid uterus - D25.9   4. Personal history of breast cancer - Z85.3   Treatment  1. Pre-operative clearance  Notes: Discussed R/B/A of abdominal hysterectomy. Pt is not a candidate for robotic hysterectomy b/c uterus is not very mobile, has not had vaginal deliveries and cervix is not easily visualized. Risk  of bleeding, extending recovery and increased risk of infection discussed.    2. Complex cyst of left ovary  Notes: Suspect ovay is not malignant but would recommend removal of right ovary in addition to left. Pt is aware of risk of malignancy in remaining ovary and an inability to detecte it early. Discussed with Dr. Crista Elliot and pt informed of his recommendations. Pt still desires to keep ovary to which I will comply.    Follow Up  2 Weeks post op

## 2015-04-01 ENCOUNTER — Encounter (HOSPITAL_COMMUNITY)
Admission: RE | Disposition: A | Discharge status: Home / Self Care | Payer: Self-pay | Source: Ambulatory Visit | Attending: Obstetrics and Gynecology

## 2015-04-01 ENCOUNTER — Inpatient Hospital Stay (HOSPITAL_COMMUNITY): Payer: Managed Care, Other (non HMO) | Admitting: Certified Registered Nurse Anesthetist

## 2015-04-01 ENCOUNTER — Inpatient Hospital Stay (HOSPITAL_COMMUNITY)
Admission: RE | Admit: 2015-04-01 | Discharge: 2015-04-03 | DRG: 743 | Disposition: A | Discharge status: Home / Self Care | Payer: Managed Care, Other (non HMO) | Source: Ambulatory Visit | Attending: Obstetrics and Gynecology | Admitting: Obstetrics and Gynecology

## 2015-04-01 ENCOUNTER — Encounter (HOSPITAL_COMMUNITY): Payer: Self-pay | Admitting: Certified Registered Nurse Anesthetist

## 2015-04-01 DIAGNOSIS — K660 Peritoneal adhesions (postprocedural) (postinfection): Secondary | ICD-10-CM | POA: Diagnosis present

## 2015-04-01 DIAGNOSIS — Z79810 Long term (current) use of selective estrogen receptor modulators (SERMs): Secondary | ICD-10-CM

## 2015-04-01 DIAGNOSIS — N8329 Other ovarian cysts: Secondary | ICD-10-CM | POA: Diagnosis present

## 2015-04-01 DIAGNOSIS — Z853 Personal history of malignant neoplasm of breast: Secondary | ICD-10-CM

## 2015-04-01 DIAGNOSIS — D259 Leiomyoma of uterus, unspecified: Principal | ICD-10-CM | POA: Diagnosis present

## 2015-04-01 DIAGNOSIS — Z90710 Acquired absence of both cervix and uterus: Secondary | ICD-10-CM

## 2015-04-01 HISTORY — PX: ABDOMINAL HYSTERECTOMY: SHX81

## 2015-04-01 HISTORY — PX: OOPHORECTOMY: SHX6387

## 2015-04-01 HISTORY — PX: BILATERAL SALPINGECTOMY: SHX5743

## 2015-04-01 HISTORY — PX: LYSIS OF ADHESION: SHX5961

## 2015-04-01 LAB — CBC
HEMATOCRIT: 31.5 % — AB (ref 36.0–46.0)
HEMOGLOBIN: 10.5 g/dL — AB (ref 12.0–15.0)
MCH: 31.9 pg (ref 26.0–34.0)
MCHC: 33.3 g/dL (ref 30.0–36.0)
MCV: 95.7 fL (ref 78.0–100.0)
Platelets: 203 10*3/uL (ref 150–400)
RBC: 3.29 MIL/uL — ABNORMAL LOW (ref 3.87–5.11)
RDW: 13.5 % (ref 11.5–15.5)
WBC: 13.4 10*3/uL — AB (ref 4.0–10.5)

## 2015-04-01 LAB — PREGNANCY, URINE: PREG TEST UR: NEGATIVE

## 2015-04-01 SURGERY — HYSTERECTOMY, ABDOMINAL
Anesthesia: General | Site: Abdomen

## 2015-04-01 MED ORDER — DEXAMETHASONE SODIUM PHOSPHATE 4 MG/ML IJ SOLN
INTRAMUSCULAR | Status: AC
Start: 2015-04-01 — End: 2015-04-01
  Filled 2015-04-01: qty 1

## 2015-04-01 MED ORDER — MENTHOL 3 MG MT LOZG: 1.0000 | LOZENGE | OROMUCOSAL | Status: DC | PRN

## 2015-04-01 MED ORDER — SCOPOLAMINE 1 MG/3DAYS TD PT72
MEDICATED_PATCH | TRANSDERMAL | Status: AC
  Administered 2015-04-01: 1.5 mg via TRANSDERMAL
  Filled 2015-04-01: qty 1

## 2015-04-01 MED ORDER — ONDANSETRON HCL 4 MG/2ML IJ SOLN
4.0000 mg | Freq: Four times a day (QID) | INTRAMUSCULAR | Status: DC | PRN
  Administered 2015-04-02: 4 mg via INTRAVENOUS
  Filled 2015-04-01: qty 2

## 2015-04-01 MED ORDER — FENTANYL CITRATE (PF) 100 MCG/2ML IJ SOLN
INTRAMUSCULAR | Status: DC | PRN
  Administered 2015-04-01: 50 ug via INTRAVENOUS
  Administered 2015-04-01 (×2): 25 ug via INTRAVENOUS
  Administered 2015-04-01 (×2): 50 ug via INTRAVENOUS

## 2015-04-01 MED ORDER — PROPOFOL 10 MG/ML IV BOLUS
INTRAVENOUS | Status: AC
Start: 2015-04-01 — End: 2015-04-01
  Filled 2015-04-01: qty 20

## 2015-04-01 MED ORDER — DOCUSATE SODIUM 100 MG PO CAPS
100.0000 mg | ORAL_CAPSULE | Freq: Two times a day (BID) | ORAL | Status: DC
  Administered 2015-04-01 – 2015-04-02 (×3): 100 mg via ORAL
  Filled 2015-04-01 (×3): qty 1

## 2015-04-01 MED ORDER — EPHEDRINE SULFATE 50 MG/ML IJ SOLN
INTRAMUSCULAR | Status: DC | PRN
  Administered 2015-04-01: 10 mg via INTRAVENOUS

## 2015-04-01 MED ORDER — LACTATED RINGERS IV SOLN
INTRAVENOUS | Status: DC
  Administered 2015-04-01 – 2015-04-02 (×3): via INTRAVENOUS

## 2015-04-01 MED ORDER — NEOSTIGMINE METHYLSULFATE 10 MG/10ML IV SOLN
INTRAVENOUS | Status: DC | PRN
  Administered 2015-04-01: 3 mg via INTRAVENOUS

## 2015-04-01 MED ORDER — LACTATED RINGERS IV SOLN
INTRAVENOUS | Status: DC
  Administered 2015-04-01: 11:00:00 via INTRAVENOUS
  Administered 2015-04-01: 125 mL/h via INTRAVENOUS
  Administered 2015-04-01 (×2): via INTRAVENOUS

## 2015-04-01 MED ORDER — NEOSTIGMINE METHYLSULFATE 10 MG/10ML IV SOLN
INTRAVENOUS | Status: AC
  Filled 2015-04-01: qty 1

## 2015-04-01 MED ORDER — IBUPROFEN 600 MG PO TABS
600.0000 mg | ORAL_TABLET | Freq: Four times a day (QID) | ORAL | Status: DC | PRN
  Administered 2015-04-02 – 2015-04-03 (×2): 600 mg via ORAL
  Filled 2015-04-01 (×2): qty 1

## 2015-04-01 MED ORDER — SODIUM CHLORIDE 0.9 % IJ SOLN
INTRAMUSCULAR | Status: AC
  Filled 2015-04-01: qty 20

## 2015-04-01 MED ORDER — FENTANYL CITRATE (PF) 100 MCG/2ML IJ SOLN: 25.0000 ug | INTRAMUSCULAR | Status: DC | PRN

## 2015-04-01 MED ORDER — ACETAMINOPHEN 160 MG/5ML PO SOLN
975.0000 mg | Freq: Once | ORAL | Status: AC
  Administered 2015-04-01: 975 mg via ORAL

## 2015-04-01 MED ORDER — LACTATED RINGERS IV BOLUS (SEPSIS)
300.0000 mL | Freq: Once | INTRAVENOUS | Status: AC
  Administered 2015-04-01: 300 mL via INTRAVENOUS

## 2015-04-01 MED ORDER — PANTOPRAZOLE SODIUM 40 MG PO TBEC
40.0000 mg | DELAYED_RELEASE_TABLET | Freq: Every day | ORAL | Status: DC
  Administered 2015-04-02: 40 mg via ORAL
  Filled 2015-04-01: qty 1

## 2015-04-01 MED ORDER — ONDANSETRON HCL 4 MG/2ML IJ SOLN
INTRAMUSCULAR | Status: DC | PRN
  Administered 2015-04-01: 4 mg via INTRAVENOUS

## 2015-04-01 MED ORDER — SCOPOLAMINE 1 MG/3DAYS TD PT72
1.0000 | MEDICATED_PATCH | Freq: Once | TRANSDERMAL | Status: DC
  Administered 2015-04-01: 1.5 mg via TRANSDERMAL

## 2015-04-01 MED ORDER — LIDOCAINE HCL (CARDIAC) 20 MG/ML IV SOLN
INTRAVENOUS | Status: DC | PRN
  Administered 2015-04-01: 30 mg via INTRAVENOUS

## 2015-04-01 MED ORDER — ACETAMINOPHEN 160 MG/5ML PO SOLN
ORAL | Status: AC
  Administered 2015-04-01: 975 mg via ORAL
  Filled 2015-04-01: qty 40.6

## 2015-04-01 MED ORDER — FENTANYL CITRATE (PF) 250 MCG/5ML IJ SOLN
INTRAMUSCULAR | Status: AC
  Filled 2015-04-01: qty 5

## 2015-04-01 MED ORDER — BUPIVACAINE LIPOSOME 1.3 % IJ SUSP
20.0000 mL | Freq: Once | INTRAMUSCULAR | Status: DC
  Filled 2015-04-01: qty 20

## 2015-04-01 MED ORDER — ROCURONIUM BROMIDE 100 MG/10ML IV SOLN
INTRAVENOUS | Status: DC | PRN
  Administered 2015-04-01: 40 mg via INTRAVENOUS
  Administered 2015-04-01: 10 mg via INTRAVENOUS
  Administered 2015-04-01: 5 mg via INTRAVENOUS
  Administered 2015-04-01: 10 mg via INTRAVENOUS
  Administered 2015-04-01: 5 mg via INTRAVENOUS

## 2015-04-01 MED ORDER — STERILE WATER FOR IRRIGATION IR SOLN
Status: DC | PRN
  Administered 2015-04-01: 100 mL via INTRAVESICAL

## 2015-04-01 MED ORDER — DEXAMETHASONE SODIUM PHOSPHATE 10 MG/ML IJ SOLN
INTRAMUSCULAR | Status: DC | PRN
  Administered 2015-04-01: 4 mg via INTRAVENOUS

## 2015-04-01 MED ORDER — GLYCOPYRROLATE 0.2 MG/ML IJ SOLN
INTRAMUSCULAR | Status: DC | PRN
  Administered 2015-04-01: 0.4 mg via INTRAVENOUS

## 2015-04-01 MED ORDER — PROPOFOL 10 MG/ML IV BOLUS
INTRAVENOUS | Status: DC | PRN
  Administered 2015-04-01: 150 mg via INTRAVENOUS

## 2015-04-01 MED ORDER — ONDANSETRON HCL 4 MG PO TABS: 4.0000 mg | ORAL_TABLET | Freq: Four times a day (QID) | ORAL | Status: DC | PRN

## 2015-04-01 MED ORDER — OXYCODONE-ACETAMINOPHEN 5-325 MG PO TABS
1.0000 | ORAL_TABLET | ORAL | Status: DC | PRN
  Administered 2015-04-02 (×2): 1 via ORAL
  Filled 2015-04-01 (×2): qty 1

## 2015-04-01 MED ORDER — MIDAZOLAM HCL 2 MG/2ML IJ SOLN
INTRAMUSCULAR | Status: AC
  Filled 2015-04-01: qty 2

## 2015-04-01 MED ORDER — SIMETHICONE 80 MG PO CHEW: 80.0000 mg | CHEWABLE_TABLET | Freq: Four times a day (QID) | ORAL | Status: DC | PRN

## 2015-04-01 MED ORDER — ROCURONIUM BROMIDE 100 MG/10ML IV SOLN
INTRAVENOUS | Status: AC
  Filled 2015-04-01: qty 1

## 2015-04-01 MED ORDER — TAMOXIFEN CITRATE 10 MG PO TABS
20.0000 mg | ORAL_TABLET | Freq: Every day | ORAL | Status: DC
  Administered 2015-04-02: 20 mg via ORAL
  Filled 2015-04-01 (×2): qty 2

## 2015-04-01 MED ORDER — ONDANSETRON HCL 4 MG/2ML IJ SOLN
INTRAMUSCULAR | Status: AC
Start: 2015-04-01 — End: 2015-04-01
  Filled 2015-04-01: qty 2

## 2015-04-01 MED ORDER — LIDOCAINE HCL (PF) 1 % IJ SOLN
INTRAMUSCULAR | Status: AC
  Filled 2015-04-01: qty 5

## 2015-04-01 MED ORDER — 0.9 % SODIUM CHLORIDE (POUR BTL) OPTIME
TOPICAL | Status: DC | PRN
  Administered 2015-04-01 (×2): 1000 mL

## 2015-04-01 MED ORDER — CEFAZOLIN SODIUM-DEXTROSE 2-3 GM-% IV SOLR
2.0000 g | INTRAVENOUS | Status: AC
  Administered 2015-04-01: 2 g via INTRAVENOUS

## 2015-04-01 MED ORDER — GLYCOPYRROLATE 0.2 MG/ML IJ SOLN
INTRAMUSCULAR | Status: AC
Start: 2015-04-01 — End: 2015-04-01
  Filled 2015-04-01: qty 3

## 2015-04-01 MED ORDER — HYDROMORPHONE HCL 1 MG/ML IJ SOLN
0.2000 mg | INTRAMUSCULAR | Status: DC | PRN
  Administered 2015-04-01: 0.6 mg via INTRAVENOUS
  Administered 2015-04-02: 0.2 mg via INTRAVENOUS
  Filled 2015-04-01 (×2): qty 1

## 2015-04-01 MED ORDER — CEFAZOLIN SODIUM-DEXTROSE 2-3 GM-% IV SOLR
INTRAVENOUS | Status: AC
  Filled 2015-04-01: qty 50

## 2015-04-01 MED ORDER — SODIUM CHLORIDE 0.9 % IJ SOLN
INTRAMUSCULAR | Status: DC | PRN
  Administered 2015-04-01: 20 mL via INTRAVENOUS

## 2015-04-01 MED ORDER — MIDAZOLAM HCL 2 MG/2ML IJ SOLN
INTRAMUSCULAR | Status: DC | PRN
  Administered 2015-04-01: 2 mg via INTRAVENOUS

## 2015-04-01 SURGICAL SUPPLY — 36 items
BENZOIN TINCTURE PRP APPL 2/3 (GAUZE/BANDAGES/DRESSINGS) ×6 IMPLANT
CANISTER SUCT 3000ML (MISCELLANEOUS) ×6 IMPLANT
CLOSURE WOUND 1/2 X4 (GAUZE/BANDAGES/DRESSINGS) ×1
CLOTH BEACON ORANGE TIMEOUT ST (SAFETY) ×6 IMPLANT
CONT PATH 16OZ SNAP LID 3702 (MISCELLANEOUS) ×6 IMPLANT
DRAPE WARM FLUID 44X44 (DRAPE) IMPLANT
DRSG OPSITE POSTOP 4X10 (GAUZE/BANDAGES/DRESSINGS) ×6 IMPLANT
DURAPREP 26ML APPLICATOR (WOUND CARE) ×6 IMPLANT
FORMULA NB 0-3 ENFAMIL (FORMULA) ×6 IMPLANT
GAUZE SPONGE 4X4 16PLY XRAY LF (GAUZE/BANDAGES/DRESSINGS) ×6 IMPLANT
GLOVE BIO SURGEON STRL SZ7 (GLOVE) ×6 IMPLANT
GLOVE BIOGEL PI IND STRL 7.0 (GLOVE) ×12 IMPLANT
GLOVE BIOGEL PI INDICATOR 7.0 (GLOVE) ×6
GOWN STRL REUS W/TWL LRG LVL3 (GOWN DISPOSABLE) ×18 IMPLANT
HEMOSTAT SURGICEL 4X8 (HEMOSTASIS) ×6 IMPLANT
IV SET ADMIN PUMP GEMINI W/NLD (IV SETS) ×6 IMPLANT
NS IRRIG 1000ML POUR BTL (IV SOLUTION) ×6 IMPLANT
PACK ABDOMINAL GYN (CUSTOM PROCEDURE TRAY) ×6 IMPLANT
PAD OB MATERNITY 4.3X12.25 (PERSONAL CARE ITEMS) ×6 IMPLANT
PROTECTOR NERVE ULNAR (MISCELLANEOUS) ×6 IMPLANT
SPONGE GAUZE 4X4 12PLY STER LF (GAUZE/BANDAGES/DRESSINGS) ×6 IMPLANT
SPONGE LAP 18X18 X RAY DECT (DISPOSABLE) ×18 IMPLANT
STRIP CLOSURE SKIN 1/2X4 (GAUZE/BANDAGES/DRESSINGS) ×5 IMPLANT
SUT CHROMIC 2 0 CT 1 (SUTURE) ×12 IMPLANT
SUT PLAIN 2 0 XLH (SUTURE) ×6 IMPLANT
SUT VIC AB 0 CT1 27 (SUTURE) ×12
SUT VIC AB 0 CT1 27XCR 8 STRN (SUTURE) ×24 IMPLANT
SUT VIC AB 0 CT1 36 (SUTURE) ×18 IMPLANT
SUT VIC AB 2-0 CT1 27 (SUTURE) ×8
SUT VIC AB 2-0 CT1 TAPERPNT 27 (SUTURE) ×16 IMPLANT
SUT VIC AB 4-0 KS 27 (SUTURE) ×6 IMPLANT
SUT VICRYL 0 TIES 12 18 (SUTURE) ×6 IMPLANT
TOWEL OR 17X24 6PK STRL BLUE (TOWEL DISPOSABLE) ×12 IMPLANT
TRAY FOLEY CATH SILVER 14FR (SET/KITS/TRAYS/PACK) ×6 IMPLANT
WATER STERILE IRR 1000ML POUR (IV SOLUTION) ×6 IMPLANT
WATER STERILE IRR 1000ML UROMA (IV SOLUTION) ×6 IMPLANT

## 2015-04-01 NOTE — Anesthesia Preprocedure Evaluation (Signed)
Anesthesia Evaluation  Patient identified by MRN, date of birth, ID band Patient awake    Reviewed: Allergy & Precautions, H&P , Patient's Chart, lab work & pertinent test results, reviewed documented beta blocker date and time   Airway Mallampati: II  TM Distance: >3 FB Neck ROM: full    Dental no notable dental hx.    Pulmonary  breath sounds clear to auscultation  Pulmonary exam normal       Cardiovascular Rhythm:regular Rate:Normal     Neuro/Psych    GI/Hepatic   Endo/Other    Renal/GU      Musculoskeletal   Abdominal   Peds  Hematology   Anesthesia Other Findings   Reproductive/Obstetrics                             Anesthesia Physical Anesthesia Plan  ASA: II  Anesthesia Plan: General   Post-op Pain Management:    Induction: Intravenous  Airway Management Planned: Oral ETT  Additional Equipment:   Intra-op Plan:   Post-operative Plan: Extubation in OR  Informed Consent: I have reviewed the patients History and Physical, chart, labs and discussed the procedure including the risks, benefits and alternatives for the proposed anesthesia with the patient or authorized representative who has indicated his/her understanding and acceptance.   Dental Advisory Given and Dental advisory given  Plan Discussed with: CRNA and Surgeon  Anesthesia Plan Comments: (  Discussed general anesthesia, including possible nausea, instrumentation of airway, sore throat,pulmonary aspiration, etc. I asked if the were any outstanding questions, or  concerns before we proceeded. )        Anesthesia Quick Evaluation

## 2015-04-01 NOTE — Progress Notes (Addendum)
POD #0  Pt with c/o LBP, left per RN.  Left flank by exam.  Pain controlled o/w. VSS UOP 30 cc/1 hour.  After 300 cc bolus, ~ 100 cc in bag.  Red, clearing up.  Gen:  NAD, somnolent but alert Abd:  Dressing clean, no incisional pain. Ext:  SCDs in place Back:  Subjective pain b/w lower back and mid back.  Hg 14.1.  10.5 within 1 hour.  A/P S/p TAH, bilateral salpingectomy, Left oophrectomy Decreased UOP.  Responded to IVF bolus. Blood in urine, clearing up. Lower back, left flank pain.   S/p Exparel-local anesthesia.  Routine post op care. Strict I/Os. Heating pad to lower back prn.  BUN/Cr in am, sooner if symptomatic. Motrin 600mg , Percocet po, Dilaudid IV prn. Dr. Landry Mellow covering from 7 pm to 7 am.  Will update on current status.

## 2015-04-01 NOTE — Brief Op Note (Signed)
04/01/2015  1:27 PM  PATIENT:  Sherry Weber  49 y.o. female  PRE-OPERATIVE DIAGNOSIS:  D25.9   Fibroids, left ovarian complex cyst  POST-OPERATIVE DIAGNOSIS:  D25.9   same  PROCEDURE:  Procedure(s): HYSTERECTOMY ABDOMINAL (N/A) LYSIS OF ADHESION (N/A) BILATERAL SALPINGECTOMY (Bilateral) OOPHORECTOMY (Left)  SURGEON:  Surgeon(s) and Role:    * Thurnell Lose, MD - Primary    * Janyth Pupa, DO - Assisting  PHYSICIAN ASSISTANT: Dr. Nelda Marseille  ASSISTANTS: Technicians   ANESTHESIA:   local and general  EBL:  Total I/O In: 3000 [I.V.:3000] Out: 725 [Urine:125; Blood:600]  BLOOD ADMINISTERED:none  DRAINS: Urinary Catheter (Foley)   LOCAL MEDICATIONS USED:  OTHER Exparel  SPECIMEN:  Source of Specimen:  Uterus, cervix, left tube and ovary, right fallopian tube  DISPOSITION OF SPECIMEN:  PATHOLOGY  COUNTS:  YES  TOURNIQUET:  * No tourniquets in log *  DICTATION: .Other Dictation: Dictation Number [id number]  PLAN OF CARE: Admit to inpatient   PATIENT DISPOSITION:  PACU - hemodynamically stable.   Delay start of Pharmacological VTE agent (>24hrs) due to surgical blood loss or risk of bleeding: yes

## 2015-04-01 NOTE — Interval H&P Note (Signed)
History and Physical Interval Note:  04/01/2015 9:49 AM  Sherry Weber  has presented today for surgery, with the diagnosis of D25.9   Fibroids  The various methods of treatment have been discussed with the patient and family. After consideration of risks, benefits and other options for treatment, the patient has consented to  Procedure(s): HYSTERECTOMY ABDOMINAL (N/A) SALPINGO OOPHORECTOMY (Bilateral) as a surgical intervention .  The patient's history has been reviewed, patient examined, no change in status, stable for surgery.  I have reviewed the patient's chart and labs.  Questions were answered to the patient's satisfaction.     Simona Huh, Georgene Kopper

## 2015-04-01 NOTE — Anesthesia Procedure Notes (Signed)
Procedure Name: Intubation Date/Time: 04/01/2015 10:10 AM Performed by: Bufford Spikes Pre-anesthesia Checklist: Patient identified, Patient being monitored, Emergency Drugs available, Timeout performed and Suction available Patient Re-evaluated:Patient Re-evaluated prior to inductionOxygen Delivery Method: Circle system utilized Preoxygenation: Pre-oxygenation with 100% oxygen Intubation Type: IV induction Ventilation: Mask ventilation without difficulty Laryngoscope Size: Glidescope and 3 (LoPro) Grade View: Grade II Tube type: Oral Number of attempts: 1 Airway Equipment and Method: Stylet Placement Confirmation: ETT inserted through vocal cords under direct vision,  positive ETCO2 and breath sounds checked- equal and bilateral Secured at: 21 cm Tube secured with: Tape Dental Injury: Teeth and Oropharynx as per pre-operative assessment  Difficulty Due To: Difficult Airway- due to limited oral opening

## 2015-04-01 NOTE — Progress Notes (Signed)
Foley catheter drainage bag leaking.  Drainage bag changed.Urine remains dark brown to tea colored.

## 2015-04-01 NOTE — Anesthesia Postprocedure Evaluation (Signed)
  Anesthesia Post-op Note  Patient: Sherry Weber  Procedure(s) Performed: Procedure(s) (LRB): HYSTERECTOMY ABDOMINAL (N/A) LYSIS OF ADHESION (N/A) BILATERAL SALPINGECTOMY (Bilateral) OOPHORECTOMY (Left)  Patient Location: PACU  Anesthesia Type: General  Level of Consciousness: awake and alert   Airway and Oxygen Therapy: Patient Spontanous Breathing  Post-op Pain: mild  Post-op Assessment: Post-op Vital signs reviewed, Patient's Cardiovascular Status Stable, Respiratory Function Stable, Patent Airway and No signs of Nausea or vomiting  Last Vitals:  Filed Vitals:   04/01/15 1430  BP: 97/53  Pulse: 53  Temp:   Resp: 16    Post-op Vital Signs: stable   Complications: No apparent anesthesia complications

## 2015-04-01 NOTE — Transfer of Care (Signed)
Immediate Anesthesia Transfer of Care Note  Patient: [name]  Procedure(s) Performed: Procedure(s): HYSTERECTOMY ABDOMINAL (N/A) LYSIS OF ADHESION (N/A) BILATERAL SALPINGECTOMY (Bilateral) OOPHORECTOMY (Left)  Patient Location: PACU  Anesthesia Type:General  Level of Consciousness: awake and sedated  Airway & Oxygen Therapy: Patient Spontanous Breathing and Patient connected to nasal cannula oxygen  Post-op Assessment: Report given to RN and Post -op Vital signs reviewed and stable  Post vital signs: Reviewed and stable  Last Vitals:  Filed Vitals:   04/01/15 0842  BP: 107/58  Pulse: 56  Temp: 36.7 C  Resp: 16    Complications: No apparent anesthesia complications

## 2015-04-02 ENCOUNTER — Encounter (HOSPITAL_COMMUNITY): Payer: Self-pay | Admitting: Obstetrics and Gynecology

## 2015-04-02 DIAGNOSIS — D259 Leiomyoma of uterus, unspecified: Secondary | ICD-10-CM | POA: Diagnosis present

## 2015-04-02 DIAGNOSIS — Z853 Personal history of malignant neoplasm of breast: Secondary | ICD-10-CM | POA: Diagnosis not present

## 2015-04-02 DIAGNOSIS — Z79810 Long term (current) use of selective estrogen receptor modulators (SERMs): Secondary | ICD-10-CM | POA: Diagnosis not present

## 2015-04-02 DIAGNOSIS — K660 Peritoneal adhesions (postprocedural) (postinfection): Secondary | ICD-10-CM | POA: Diagnosis present

## 2015-04-02 DIAGNOSIS — N8329 Other ovarian cysts: Secondary | ICD-10-CM | POA: Diagnosis present

## 2015-04-02 LAB — CBC
HCT: 29.6 % — ABNORMAL LOW (ref 36.0–46.0)
HEMOGLOBIN: 10 g/dL — AB (ref 12.0–15.0)
MCH: 32.3 pg (ref 26.0–34.0)
MCHC: 33.8 g/dL (ref 30.0–36.0)
MCV: 95.5 fL (ref 78.0–100.0)
Platelets: 206 10*3/uL (ref 150–400)
RBC: 3.1 MIL/uL — ABNORMAL LOW (ref 3.87–5.11)
RDW: 13.6 % (ref 11.5–15.5)
WBC: 13.2 10*3/uL — ABNORMAL HIGH (ref 4.0–10.5)

## 2015-04-02 LAB — BASIC METABOLIC PANEL
ANION GAP: 3 — AB (ref 5–15)
BUN: 13 mg/dL (ref 6–23)
CALCIUM: 8.1 mg/dL — AB (ref 8.4–10.5)
CO2: 29 mmol/L (ref 19–32)
CREATININE: 0.69 mg/dL (ref 0.50–1.10)
Chloride: 105 mmol/L (ref 96–112)
GFR calc non Af Amer: >90 mL/min (ref >90)
Glucose, Bld: 106 mg/dL — ABNORMAL HIGH (ref 70–99)
Potassium: 4.2 mmol/L (ref 3.5–5.1)
Sodium: 137 mmol/L (ref 135–145)

## 2015-04-02 MED ORDER — IBUPROFEN 600 MG PO TABS: 600.0000 mg | ORAL_TABLET | Freq: Four times a day (QID) | ORAL | Status: DC | PRN

## 2015-04-02 MED ORDER — OXYCODONE-ACETAMINOPHEN 5-325 MG PO TABS: 1.0000 | ORAL_TABLET | ORAL | Status: DC | PRN

## 2015-04-02 NOTE — Anesthesia Postprocedure Evaluation (Signed)
  Anesthesia Post-op Note  Patient: Sherry Weber  Procedure(s) Performed: Procedure(s): HYSTERECTOMY ABDOMINAL (N/A) LYSIS OF ADHESION (N/A) BILATERAL SALPINGECTOMY (Bilateral) OOPHORECTOMY (Left)  Patient Location: [facility] Unit  Anesthesia Type:General  Level of Consciousness: awake, alert  and oriented  Airway and Oxygen Therapy: Patient Spontanous Breathing  Post-op Pain: none  Post-op Assessment: Post-op Vital signs reviewed, Patient's Cardiovascular Status Stable, Respiratory Function Stable, No signs of Nausea or vomiting and Pain level controlled  Post-op Vital Signs: Reviewed and stable  Last Vitals:  Filed Vitals:   04/02/15 0532  BP: 118/60  Pulse: 53  Temp: 37.3 C  Resp: 14    Complications: No apparent anesthesia complications

## 2015-04-02 NOTE — Addendum Note (Signed)
Addendum  created 04/02/15 0805 by Jonna Munro, CRNA   Modules edited: Notes Section   Notes Section:  File: [id number]

## 2015-04-02 NOTE — Progress Notes (Signed)
POD #1  Pt without major complaints.  Tolerating regular diet.  No flatus. Pain well controlled.  Left back pain now seems more diffuse but well controlled with medications.  Ambulating in halls.  +vaginal bleeding but none on pad x 4 hours.  VSS TC 99.1  UOP 800 x 3 hours, 1000+ in bag, clear x 3 hours  Gen:  NAD, resting quietly Abd:  Dressing clean, no incisional pain. Ext:  SCDs in place Perineum dry.  Hg 14.1 to 10.5 to 10.0 this am  A/P S/p TAH, bilateral salpingectomy, Left oophrectomy- Doing well. Decreased UOP POD#0.  Output has increased significantly. Hematuria has resolved. S/p Exparel-local anesthesia.  Routine post op care. Strict I/Os. Saline lock IV. Remove foley. Continue with regular diet. Encouraged ambulation.

## 2015-04-02 NOTE — Discharge Instructions (Signed)
Abdominal Hysterectomy, Care After Refer to this sheet in the next few weeks. These instructions provide you with information on caring for yourself after your procedure. Your health care provider may also give you more specific instructions. Your treatment has been planned according to current medical practices, but problems sometimes occur. Call your health care provider if you have any problems or questions after your procedure.  WHAT TO EXPECT AFTER THE PROCEDURE After your procedure, it is typical to have the following:  Pain.  Feeling tired.  Poor appetite.  Less interest in sex. HOME CARE INSTRUCTIONS  It takes 4-6 weeks to recover from this surgery. Make sure you follow all your health care provider's instructions. Home care instructions may include:  Take pain medicines only as directed by your health care provider. Do not take over-the-counter pain medicines without checking with your health care provider first.  Change your bandage as directed by your health care provider.  Return to your health care provider to have your sutures taken out.  Take showers instead of baths for 2-3 weeks. Ask your health care provider when it is safe to start showering.  Do not douche, use tampons, or have sexual intercourse for at least 6 weeks or until your health care provider says you can.   Follow your health care provider's advice about exercise, lifting, driving, and general activities.  Get plenty of rest and sleep.   Do not lift anything heavier than a gallon of milk (about 10 lb [4.5 kg]) for the first month after surgery.  You can resume your normal diet if your health care provider says it is okay.   Do not drink alcohol until your health care provider says you can.   If you are constipated, ask your health care provider if you can take a mild laxative.  Eating foods high in fiber may also help with constipation. Eat plenty of raw fruits and vegetables, whole grains, and  beans.  Drink enough fluids to keep your urine clear or pale yellow.   Try to have someone at home with you for the first 1-2 weeks to help around the house.  Keep all follow-up appointments. SEEK MEDICAL CARE IF:   You have chills or fever.  You have swelling, redness, or pain in the area of your incision that is getting worse.   You have pus coming from the incision.   You notice a bad smell coming from the incision or bandage.   Your incision breaks open.   You feel dizzy or light-headed.   You have pain or bleeding when you urinate.   You have persistent diarrhea.   You have persistent nausea and vomiting.   You have abnormal vaginal discharge.   You have a rash.   You have any type of abnormal reaction or develop an allergy to your medicine.   Your pain medicine is not helping.  SEEK IMMEDIATE MEDICAL CARE IF:   You have a fever and your symptoms suddenly get worse.  You have severe abdominal pain.  You have chest pain.  You have shortness of breath.  You faint.  You have pain, swelling, or redness of your leg.  You have heavy vaginal bleeding with blood clots. MAKE SURE YOU:  Understand these instructions.  Will watch your condition.  Will get help right away if you are not doing well or get worse. Document Released: 06/10/2005 Document Revised: 11/26/2013 Document Reviewed: 09/13/2013 [employer] Patient Information 2015 [employer]. This information is not intended   to replace advice given to you by your health care provider. Make sure you discuss any questions you have with your health care provider.  

## 2015-04-02 NOTE — Op Note (Signed)
[name], CULMER NO.:  [id number]  MEDICAL RECORD NO.:  [id number]  LOCATION:  9303                          FACILITY:  [facility]  PHYSICIAN:  Jola Schmidt, MD   DATE OF BIRTH:  [birth year]  DATE OF PROCEDURE:  04/01/2015 DATE OF DISCHARGE:                              OPERATIVE REPORT   PREOPERATIVE DIAGNOSES:  Uterine fibroids, left ovarian complex cyst.  POSTOPERATIVE DIAGNOSES:  Uterine fibroids, left ovarian complex cyst and adhesions of small bowel.  PROCEDURE:  Total abdominal hysterectomy, bilateral salpingectomy and left oophorectomy and lysis of adhesions.  SURGEON:  Raul Del. Simona Huh, MD  ASSISTANT:  Dr. Nelda Marseille (assistant needed due to the complex nature and difficult visualization due to a large fibroid uterus).  OTHER ASSISTANT:  Merchant navy officer.  ANESTHESIA:  Local and general.  EBL:  600.  URINE OUTPUT:  125, blood tinged.  BLOOD ADMINISTERED:  None.  DRAINS:  Urine catheter.  LOCAL:  Exparel 20 mL.  SPECIMEN:  Uterus, cervix, left tube and ovary and right fallopian tube, sent to Pathology.  COUNTS:  Correct.  DISPOSITION:  To PACU hemodynamically stable.  COMPLICATIONS:  None.  FINDINGS:  Large fibroid uterus with posterior significantly 7 cm fibroid in the posterior cul-de-sac, adhesions of the small bowel to the left round ligament.  Normal pelvic fluid.  No abnormalities with the bowel or the omentum, suspected a posterior fibroid on the left corner with atypical vessels adjacent to that and the bladder.  The cyst appeared to be mucinous or cystic fluid noted on the left mesosalpinx.  PROCEDURE IN DETAIL:  [name] was identified in the holding area. She was then taken to the operating room with IV running.  She underwent general endotracheal anesthesia without complication in the supine position.  She was prepped and draped in normal fashion.  Foley catheter was placed.  A time-out was performed prior to starting the  procedure. Ancef 2 g IV was administered.  Abdomen was marked.  A Pfannenstiel skin incision was made 2 cm above the symphysis pubis with the scalpel and carried down to the underlying layer of the fascia with the Bovie.  Fascia was incised and extended laterally with the Mayo scissors.  Fascia was then surgically removed from the rectus muscles.  Fascia was very stretchy and pliable.  Rectus muscles were then separated at the midline.  Peritoneum was very thin and we were able to see a fibroid through the peritoneum and was tented up with hemostats and entered sharply with the Metzenbaum scissors.  The incision was stretched.  We placed an Fredia Sorrow retractor making sure that we did not get bowel or omentum into the clamp.  We would have to readjust a few times after packing the bowel and being able to see the left pelvic sidewall.  An anchor stitch was placed at the fundus with a 0 Vicryl and held with a hemostat.  Again the uterus took up most of the incision, so identified my rounds on the right, grabbed those with Kocher clamps and transected and suture ligated.  Same was done on the opposite side. Once we took that down, the bladder flap was developed.  We  were actually high.  Bladder came down very nicely.  Once we were able to remove that we did try to mobilize it little bit more because again we could not see the adnexa, could not see the cyst at all, but we grabbed the fallopian tube and transected that on both sides to help with mobilization and visualization.  We were then able to pull the uterus up to visualize a little bit more and we transected through the broad ligament with a curved Heaney clamp and suture ligated with 0 Vicryl until we were able to get down adjacent to the cervix.  We grabbed the edges with the Kocher clamps and amputated the uterus.  We then were able to identify the lower uterine segment better. Prior to removal, we developed the bladder  flap, the CRNA noted that the urine was blood tinged.  At that point, it was difficult to see the bladder, but it was behind the lower blade of the [employer]. Once we amputated the uterus, we were able to take down the bladder, but it was significantly already off the lower uterine segment.  We then skeletonized the cervix with straight Masterson and grasped the apex of the fornix with the Heaney clamps and with the Jorgenson clamps.  We removed the cervix.  Of note on the right side, there felt to be a cervical fibroid that was difficult to cut through.  There were also some very large tenuous vessels behind the bladder, but feeling this little area bled quite a bit and we tamponaded that with or we held it with some Kelly's until it was obvious to me to get better visualization.  Once the cervix was out, it was closed in an interrupted fashion with 0 Vicryl that was held.  The vessel on the left corner of the bladder was then grasped with a right angle retractor and tied off with 2-0 Vicryl until the bleeding was under control.  We did have to Bovie, hold pressure to slow that area down.  Of note, prior to removing the cervical stump due to the color of the urine, the bladder was inflated with sterile milk and no leakage or bladder injury was noted. We also saw the ureters on both sides prior to removal of the fallopian tubes.  We went back to remove the left tube and ovary with a curved Haney freehand tie and then suture ligated.  Hemostatic.  On the other side, the window on the mesosalpinx and suture ligated with 0 Vicryl and took it into and cut the remaining portion of the fallopian tube.  There was some bleeding noted on the right ovary, it was a slight ooze, we bovied it and it was hemostatic.  We did copious irrigation.  We did need to put a few more sutures in the cuff to make the cuff hemostatic.  We did have quite a bit of bleeding behind the ovary especially in  that left corner, but we held pressure and that seemed to slow it the more we pulled on the cuff the same to aggravate it.  At the time of closure, we had tamponaded, bovied, and it appeared dry, we placed Surgicel on the right ovary and on the cuff.  All instruments were removed from the belly.  The fascia was then closed with 0 Vicryl.  Of note, the fascia again was very stretchy and some of the sutures on the top layer were tearing through the fascia once they were anchored because  the fascia was so thin.  That was tied at the midline with 0 Vicryl.  Exparel was then injected at the fascia.  Then, the subcutaneous space was closed with 2-0 plain gut in a running fashion.  Exparel was then injected in the subcutaneous space.  Skin was then reapproximated with 4-0 Vicryl on a Keith needle in a subcuticular fashion and Exparel was injected along the suture line with minimal oozing of the medication.  Steri-Strips were applied.  Pressure dressing will be applied.  She tolerated the procedure well and went to PACU in stable condition.     Jola Schmidt, MD     EBV/MEDQ  D:  04/01/2015  T:  04/02/2015  Job:  [id number]

## 2015-04-03 MED ORDER — OXYCODONE-ACETAMINOPHEN 5-325 MG PO TABS: 1.0000 | ORAL_TABLET | ORAL | Status: DC | PRN

## 2015-04-03 MED ORDER — DOCUSATE SODIUM 100 MG PO CAPS: 100.0000 mg | ORAL_CAPSULE | Freq: Two times a day (BID) | ORAL | Status: DC | PRN

## 2015-04-03 NOTE — Progress Notes (Signed)
POD #2  Pt resting comfortably in bed, no acute complaints.  Tolerating regular diet.  + flatus, no BM. Pain well controlled.  Ambulating and voiding freely.  O: BP 99/63 mmHg  Pulse 63  Temp(Src) 98.4 F (36.9 C) (Oral)  Resp 16  Ht 5\' 3"  (1.6 m)  Wt 50.803 kg (112 lb)  BMI 19.84 kg/m2  SpO2 99%   Gen:  NAD, CV: RRR Lungs: CTAB Abd:  Dressing clean, no incisional pain. Ext:  SCDs in place Perineum dry.  CBC Latest Ref Rng 04/02/2015 04/01/2015 03/26/2015  WBC 4.0 - 10.5 K/uL 13.2(H) 13.4(H) 5.6  Hemoglobin 12.0 - 15.0 g/dL 10.0(L) 10.5(L) 14.1  Hematocrit 36.0 - 46.0 % 29.6(L) 31.5(L) 41.2  Platelets 150 - 400 K/uL 206 203 286    A/P S/p TAH, bilateral salpingectomy, Left oophrectomy- Doing well. Voiding freely with adequate UOP.Hematuria has resolved. Pain well controlled Continue with regular diet. Encouraged ambulation.  Meeting postoperative milestones appropriately, plan for discharge home today.  Janyth Pupa, DO [phone] (pager) [phone] (office)

## 2015-05-07 NOTE — Discharge Summary (Signed)
Physician Discharge Summary  Patient ID: Sherry Weber MRN: [id number] DOB/AGE: 1966/09/20 49 y.o.  Admit date: 04/01/2015 Discharge date: 05/07/2015  Admission Diagnoses:Fibroid uterus, h/o Breast cancer  Discharge Diagnoses: Same Principal Problem:   S/P TAH (total abdominal hysterectomy) Bilateral salpingectomy  Discharged Condition: good  Hospital Course: Uncomplicated.  Exparel used intraop.  Pain well controlled, ambulating well.  Consults: None  Significant Diagnostic Studies: labs: Hg appropriate post op  Treatments: surgery: TAH, BS  Discharge Exam: Blood pressure 99/63, pulse 63, temperature 98.4 F (36.9 C), temperature source Oral, resp. rate 16, height 5\' 3"  (1.6 m), weight 50.803 kg (112 lb), SpO2 99 %. See progress note.  Disposition: 01-Home or Self Care     Medication List    TAKE these medications        acetaminophen 325 MG tablet  Commonly known as:  TYLENOL  Take 650 mg by mouth every 6 (six) hours as needed for mild pain.     docusate sodium 100 MG capsule  Commonly known as:  COLACE  Take 1 capsule (100 mg total) by mouth 2 (two) times daily as needed for mild constipation.     ibuprofen 600 MG tablet  Commonly known as:  ADVIL,MOTRIN  Take 1 tablet (600 mg total) by mouth every 6 (six) hours as needed (mild pain).     omeprazole 40 MG capsule  Commonly known as:  PRILOSEC  Take 40 mg by mouth daily.     oxyCODONE-acetaminophen 5-325 MG per tablet  Commonly known as:  PERCOCET/ROXICET  Take 1-2 tablets by mouth every 4 (four) hours as needed (moderate to severe pain (when tolerating fluids)).     tamoxifen 20 MG tablet  Commonly known as:  NOLVADEX  Take 1 tablet (20 mg total) by mouth daily.           Follow-up Information    Follow up with Thurnell Lose, MD In 2 weeks.   Specialty:  Obstetrics and Gynecology   Why:  For wound re-check   Contact information:   [address] [place]  [address] [phone]       Signed: Thurnell Lose 05/07/2015, 9:11 AM

## 2015-06-11 ENCOUNTER — Encounter: Payer: Self-pay | Admitting: *Deleted

## 2015-06-11 NOTE — Progress Notes (Signed)
Received mammo report from [facility], sent to scan. 

## 2015-06-14 NOTE — Assessment & Plan Note (Addendum)
Low-grade DCIS involving the right breast: ER/PR positive status post right breast lumpectomy and radiation therapy. Started tamoxifen therapy on Oct 2015 S/P TAH BSO June 2016 Toxicities to tamoxifen: patient has no side effects of tamoxifen.  Breast Cancer Surveillance: 1. Breast exam 06/15/15: Normal 2. Mammogram No abnormalities. Postsurgical changes. Breast Density Category  I recommended that she get 3-D mammograms for surveillance. Discussed the differences between different breast density categories.   Discussed the option of continuing tam vs switching to AI  RTC in 6 months

## 2015-06-15 ENCOUNTER — Telehealth: Payer: Self-pay | Admitting: Hematology and Oncology

## 2015-06-15 ENCOUNTER — Encounter: Payer: Self-pay | Admitting: Hematology and Oncology

## 2015-06-15 ENCOUNTER — Ambulatory Visit (HOSPITAL_BASED_OUTPATIENT_CLINIC_OR_DEPARTMENT_OTHER): Payer: Managed Care, Other (non HMO) | Admitting: Hematology and Oncology

## 2015-06-15 VITALS — BP 109/66 | HR 60 | Temp 98.3°F | Resp 18 | Ht 63.0 in | Wt 110.5 lb

## 2015-06-15 DIAGNOSIS — Z170 Estrogen receptor positive status [ER+]: Secondary | ICD-10-CM

## 2015-06-15 DIAGNOSIS — D0511 Intraductal carcinoma in situ of right breast: Secondary | ICD-10-CM | POA: Diagnosis not present

## 2015-06-15 DIAGNOSIS — Z79810 Long term (current) use of selective estrogen receptor modulators (SERMs): Secondary | ICD-10-CM

## 2015-06-15 MED ORDER — TAMOXIFEN CITRATE 20 MG PO TABS: 20.0000 mg | ORAL_TABLET | Freq: Every day | ORAL | Status: DC
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
## 2015-06-15 NOTE — Addendum Note (Signed)
Addended by: Prentiss Bells on: 06/15/2015 02:37 PM   Modules accepted: Orders

## 2015-06-15 NOTE — Telephone Encounter (Signed)
lvm for pt regarding to JAn 2017 appt...mailed pt appt sched/avs and letter

## 2015-06-19 ENCOUNTER — Telehealth: Payer: Self-pay

## 2015-06-19 NOTE — Telephone Encounter (Signed)
Mammogram results dtd 06/01/15 rcvd from [facility].  Reviewed by Dr. Lindi Adie.  Sent to scan.

## 2015-08-11 ENCOUNTER — Telehealth: Payer: Self-pay

## 2015-08-11 NOTE — Telephone Encounter (Signed)
Office notes dtd 04/14/15 Dr. Simona Huh.  Reviewed by Dr. Lindi Adie.  Sent to scan.

## 2015-08-12 ENCOUNTER — Telehealth: Payer: Self-pay

## 2015-08-12 NOTE — Telephone Encounter (Signed)
Pathology report dtd 04/03/15 rcvd from [facility].  Plan of care update rcvd from [facility]osp[facility] dtd 8/24.  Reviewed by Dr Lindi Adie.  Sent to scan.

## 2015-09-10 ENCOUNTER — Encounter: Payer: Self-pay | Admitting: General Surgery

## 2015-09-10 NOTE — Progress Notes (Signed)
Sherry Weber 09/10/2015 2:31 PM Location: [facility] Patient #: [id number] DOB: August 22, 1966 Married / Language: English / Race: Refused to Report/Unreported Female History of Present Illness [name] MD; 09/10/2015 3:07 PM) Patient words: breast f/u.  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 Margins are clear. Estrogen receptor and progesterone receptor positive.  History: She is here for long-term follow-up visit. She is on tamoxifen. She had a TAH/BSO April 01, 2015. Has a firm area in the incision she would like me to look at. Since I saw her last, she noticed a small mass in the right axilla. Mammogram performed June 01, 2015 demonstrated no evidence of malignancy.  Exam: General- Is in NAD. Right breast-a well-healed upper outer quadrant scar with some indentation. No palpable masses.  Left breast-no palpable masses or suspicious skin changes.  Lymph nodes-there is a 1 cm palpable soft tissue mass in the right axilla consistent with lymph node clinically. It is near the lumpectomy incision. This was not present previously. No supraclavicular or left axillary adenopathy.  Allergies (Sonya Bynum, CMA; 09/10/2015 2:32 PM) No Known Drug Allergies 01/14/2015  Medication History (Sonya Bynum, CMA; 09/10/2015 2:32 PM) Tylenol Cold Multi-Symptom (5-10-200-325MG  Tablet, Oral as needed) Active. Tamoxifen Citrate (20MG  Tablet, Oral) Active. Medications Reconciled    Vitals (Sonya Bynum CMA; 09/10/2015 2:32 PM) 09/10/2015 2:31 PM Weight: 109 lb Height: 63in Body Surface Area: 1.48 m Body Mass Index: 19.31 kg/m Temp.: 97.91F(Temporal)  Pulse: 77 (Regular)  BP: 128/80 (Sitting, Left Arm, Standard)     Assessment & Plan [name] MD; 09/10/2015 3:09 PM)  DCIS (DUCTAL CARCINOMA IN  SITU), RIGHT (D05.11) Impression: No clinical or radiographic evidence of recurrence in the breast.  PALPABLE LYMPH NODE (R59.9) Impression: This is new since her last visit.  Plan: I recommended removal of the right axillary lymph node. We discussed the procedure, rationale, and risks. Risks include but are not limited to bleeding, infection, wound healing problems, anesthesia. She understands all this and agrees with the plan.  Jackolyn Confer, MD

## 2015-09-28 ENCOUNTER — Encounter (HOSPITAL_BASED_OUTPATIENT_CLINIC_OR_DEPARTMENT_OTHER): Payer: Self-pay | Admitting: *Deleted

## 2015-09-29 ENCOUNTER — Encounter (HOSPITAL_BASED_OUTPATIENT_CLINIC_OR_DEPARTMENT_OTHER)
Admission: RE | Admit: 2015-09-29 | Discharge: 2015-09-29 | Disposition: A | Discharge status: Home / Self Care | Payer: Managed Care, Other (non HMO) | Source: Ambulatory Visit | Attending: General Surgery | Admitting: General Surgery

## 2015-09-29 DIAGNOSIS — R599 Enlarged lymph nodes, unspecified: Secondary | ICD-10-CM | POA: Diagnosis not present

## 2015-09-29 DIAGNOSIS — D0511 Intraductal carcinoma in situ of right breast: Secondary | ICD-10-CM | POA: Diagnosis not present

## 2015-09-29 DIAGNOSIS — Z79810 Long term (current) use of selective estrogen receptor modulators (SERMs): Secondary | ICD-10-CM | POA: Diagnosis not present

## 2015-09-29 LAB — COMPREHENSIVE METABOLIC PANEL
ALBUMIN: 3.5 g/dL (ref 3.5–5.0)
ALK PHOS: 43 U/L (ref 38–126)
ALT: 41 U/L (ref 14–54)
ANION GAP: 4 — AB (ref 5–15)
AST: 42 U/L — AB (ref 15–41)
BILIRUBIN TOTAL: 0.8 mg/dL (ref 0.3–1.2)
BUN: 18 mg/dL (ref 6–20)
CALCIUM: 8.9 mg/dL (ref 8.9–10.3)
CO2: 31 mmol/L (ref 22–32)
CREATININE: 0.73 mg/dL (ref 0.44–1.00)
Chloride: 106 mmol/L (ref 101–111)
GFR calc Af Amer: >60 mL/min (ref >60)
GFR calc non Af Amer: >60 mL/min (ref >60)
GLUCOSE: 128 mg/dL — AB (ref 65–99)
Potassium: 3.8 mmol/L (ref 3.5–5.1)
Sodium: 141 mmol/L (ref 135–145)
TOTAL PROTEIN: 5.8 g/dL — AB (ref 6.5–8.1)

## 2015-09-29 LAB — CBC WITH DIFFERENTIAL/PLATELET
BASOS PCT: 2 %
Basophils Absolute: 0.1 10*3/uL (ref 0.0–0.1)
Eosinophils Absolute: 0.1 10*3/uL (ref 0.0–0.7)
Eosinophils Relative: 2 %
HEMATOCRIT: 38.3 % (ref 36.0–46.0)
HEMOGLOBIN: 12.7 g/dL (ref 12.0–15.0)
Lymphocytes Relative: 30 %
Lymphs Abs: 1.1 10*3/uL (ref 0.7–4.0)
MCH: 31.6 pg (ref 26.0–34.0)
MCHC: 33.2 g/dL (ref 30.0–36.0)
MCV: 95.3 fL (ref 78.0–100.0)
MONOS PCT: 8 %
Monocytes Absolute: 0.3 10*3/uL (ref 0.1–1.0)
NEUTROS ABS: 2.1 10*3/uL (ref 1.7–7.7)
NEUTROS PCT: 58 %
Platelets: 243 10*3/uL (ref 150–400)
RBC: 4.02 MIL/uL (ref 3.87–5.11)
RDW: 14.7 % (ref 11.5–15.5)
WBC: 3.6 10*3/uL — ABNORMAL LOW (ref 4.0–10.5)

## 2015-09-29 LAB — PROTIME-INR
INR: 1.02 (ref 0.00–1.49)
Prothrombin Time: 13.6 seconds (ref 11.6–15.2)

## 2015-09-30 NOTE — Progress Notes (Signed)
Dr. Rodman Comp reviewed labs - [facility] for surgery

## 2015-10-02 ENCOUNTER — Ambulatory Visit (HOSPITAL_BASED_OUTPATIENT_CLINIC_OR_DEPARTMENT_OTHER): Payer: Managed Care, Other (non HMO) | Admitting: Anesthesiology

## 2015-10-02 ENCOUNTER — Ambulatory Visit (HOSPITAL_BASED_OUTPATIENT_CLINIC_OR_DEPARTMENT_OTHER)
Admission: RE | Admit: 2015-10-02 | Discharge: 2015-10-02 | Disposition: A | Discharge status: Home / Self Care | Payer: Managed Care, Other (non HMO) | Source: Ambulatory Visit | Attending: General Surgery | Admitting: General Surgery

## 2015-10-02 ENCOUNTER — Encounter (HOSPITAL_BASED_OUTPATIENT_CLINIC_OR_DEPARTMENT_OTHER)
Admission: RE | Disposition: A | Discharge status: Home / Self Care | Payer: Self-pay | Source: Ambulatory Visit | Attending: General Surgery

## 2015-10-02 ENCOUNTER — Encounter (HOSPITAL_BASED_OUTPATIENT_CLINIC_OR_DEPARTMENT_OTHER): Payer: Self-pay | Admitting: *Deleted

## 2015-10-02 DIAGNOSIS — Z79810 Long term (current) use of selective estrogen receptor modulators (SERMs): Secondary | ICD-10-CM | POA: Insufficient documentation

## 2015-10-02 DIAGNOSIS — R599 Enlarged lymph nodes, unspecified: Secondary | ICD-10-CM | POA: Diagnosis not present

## 2015-10-02 DIAGNOSIS — D0511 Intraductal carcinoma in situ of right breast: Secondary | ICD-10-CM | POA: Insufficient documentation

## 2015-10-02 HISTORY — DX: Nausea with vomiting, unspecified: R11.2

## 2015-10-02 HISTORY — PX: LYMPH NODE BIOPSY: SHX201

## 2015-10-02 HISTORY — DX: Other specified postprocedural states: Z98.890

## 2015-10-02 SURGERY — LYMPH NODE BIOPSY
Anesthesia: General | Site: Axilla | Laterality: Right

## 2015-10-02 MED ORDER — SCOPOLAMINE 1 MG/3DAYS TD PT72
MEDICATED_PATCH | TRANSDERMAL | Status: AC
  Filled 2015-10-02: qty 1

## 2015-10-02 MED ORDER — PROPOFOL 10 MG/ML IV BOLUS: INTRAVENOUS | Status: DC | PRN

## 2015-10-02 MED ORDER — FENTANYL CITRATE (PF) 100 MCG/2ML IJ SOLN
INTRAMUSCULAR | Status: AC
  Filled 2015-10-02: qty 4

## 2015-10-02 MED ORDER — CEFAZOLIN SODIUM-DEXTROSE 2-3 GM-% IV SOLR
INTRAVENOUS | Status: AC
  Filled 2015-10-02: qty 50

## 2015-10-02 MED ORDER — MEPERIDINE HCL 25 MG/ML IJ SOLN: 6.2500 mg | INTRAMUSCULAR | Status: DC | PRN

## 2015-10-02 MED ORDER — SCOPOLAMINE 1 MG/3DAYS TD PT72
1.0000 | MEDICATED_PATCH | Freq: Once | TRANSDERMAL | Status: DC | PRN
Start: 2015-10-02 — End: 2015-10-02
  Administered 2015-10-02: 1.5 mg via TRANSDERMAL

## 2015-10-02 MED ORDER — LIDOCAINE-EPINEPHRINE (PF) 1 %-1:200000 IJ SOLN
INTRAMUSCULAR | Status: AC
  Filled 2015-10-02: qty 30

## 2015-10-02 MED ORDER — SUCCINYLCHOLINE CHLORIDE 20 MG/ML IJ SOLN
INTRAMUSCULAR | Status: AC
  Filled 2015-10-02: qty 1

## 2015-10-02 MED ORDER — DEXAMETHASONE SODIUM PHOSPHATE 4 MG/ML IJ SOLN
INTRAMUSCULAR | Status: DC | PRN
  Administered 2015-10-02: 10 mg via INTRAVENOUS

## 2015-10-02 MED ORDER — BUPIVACAINE HCL (PF) 0.5 % IJ SOLN
INTRAMUSCULAR | Status: DC | PRN
  Administered 2015-10-02: 8 mL
  Administered 2015-10-02: 2 mL

## 2015-10-02 MED ORDER — ONDANSETRON HCL 4 MG/2ML IJ SOLN
INTRAMUSCULAR | Status: DC | PRN
  Administered 2015-10-02: 4 mg via INTRAVENOUS

## 2015-10-02 MED ORDER — GLYCOPYRROLATE 0.2 MG/ML IJ SOLN
0.2000 mg | Freq: Once | INTRAMUSCULAR | Status: AC | PRN
  Administered 2015-10-02: 0.2 mg via INTRAVENOUS

## 2015-10-02 MED ORDER — HYDROCODONE-ACETAMINOPHEN 5-325 MG PO TABS: 1.0000 | ORAL_TABLET | ORAL | Status: DC | PRN

## 2015-10-02 MED ORDER — DEXAMETHASONE SODIUM PHOSPHATE 10 MG/ML IJ SOLN
INTRAMUSCULAR | Status: AC
  Filled 2015-10-02: qty 1

## 2015-10-02 MED ORDER — LIDOCAINE HCL (CARDIAC) 20 MG/ML IV SOLN
INTRAVENOUS | Status: DC | PRN
  Administered 2015-10-02: 100 mg via INTRAVENOUS

## 2015-10-02 MED ORDER — HYDROMORPHONE HCL 1 MG/ML IJ SOLN: 0.2500 mg | INTRAMUSCULAR | Status: DC | PRN

## 2015-10-02 MED ORDER — PROPOFOL 10 MG/ML IV BOLUS
INTRAVENOUS | Status: DC | PRN
  Administered 2015-10-02: 150 mg via INTRAVENOUS

## 2015-10-02 MED ORDER — ONDANSETRON HCL 4 MG/2ML IJ SOLN
INTRAMUSCULAR | Status: AC
  Filled 2015-10-02: qty 2

## 2015-10-02 MED ORDER — SODIUM BICARBONATE 4 % IV SOLN
INTRAVENOUS | Status: AC
  Filled 2015-10-02: qty 5

## 2015-10-02 MED ORDER — PROPOFOL 500 MG/50ML IV EMUL
INTRAVENOUS | Status: AC
  Filled 2015-10-02: qty 50

## 2015-10-02 MED ORDER — BUPIVACAINE HCL (PF) 0.5 % IJ SOLN
INTRAMUSCULAR | Status: AC
  Filled 2015-10-02: qty 30

## 2015-10-02 MED ORDER — LACTATED RINGERS IV SOLN
INTRAVENOUS | Status: DC
  Administered 2015-10-02 (×2): via INTRAVENOUS

## 2015-10-02 MED ORDER — LIDOCAINE HCL (CARDIAC) 20 MG/ML IV SOLN
INTRAVENOUS | Status: AC
  Filled 2015-10-02: qty 5

## 2015-10-02 MED ORDER — ONDANSETRON HCL 4 MG/2ML IJ SOLN: 4.0000 mg | Freq: Once | INTRAMUSCULAR | Status: DC | PRN

## 2015-10-02 MED ORDER — FENTANYL CITRATE (PF) 100 MCG/2ML IJ SOLN
50.0000 ug | INTRAMUSCULAR | Status: DC | PRN
  Administered 2015-10-02: 50 ug via INTRAVENOUS

## 2015-10-02 MED ORDER — CEFAZOLIN SODIUM-DEXTROSE 2-3 GM-% IV SOLR
2.0000 g | INTRAVENOUS | Status: AC
  Administered 2015-10-02: 2 g via INTRAVENOUS

## 2015-10-02 MED ORDER — MIDAZOLAM HCL 2 MG/2ML IJ SOLN
INTRAMUSCULAR | Status: AC
  Filled 2015-10-02: qty 2

## 2015-10-02 MED ORDER — MIDAZOLAM HCL 2 MG/2ML IJ SOLN
1.0000 mg | INTRAMUSCULAR | Status: DC | PRN
Start: 2015-10-02 — End: 2015-10-02
  Administered 2015-10-02: 2 mg via INTRAVENOUS

## 2015-10-02 MED ORDER — GLYCOPYRROLATE 0.2 MG/ML IJ SOLN
INTRAMUSCULAR | Status: AC
  Filled 2015-10-02: qty 1

## 2015-10-02 SURGICAL SUPPLY — 39 items
BENZOIN TINCTURE PRP APPL 2/3 (GAUZE/BANDAGES/DRESSINGS) IMPLANT
BLADE SURG 15 STRL LF DISP TIS (BLADE) ×1 IMPLANT
BLADE SURG 15 STRL SS (BLADE) ×2
CHLORAPREP W/TINT 26ML (MISCELLANEOUS) ×3 IMPLANT
CLEANER CAUTERY TIP 5X5 PAD (MISCELLANEOUS) IMPLANT
CLOSURE WOUND 1/2 X4 (GAUZE/BANDAGES/DRESSINGS)
COVER BACK TABLE 60X90IN (DRAPES) ×3 IMPLANT
COVER MAYO STAND STRL (DRAPES) ×3 IMPLANT
DRAPE LAPAROTOMY 100X72 PEDS (DRAPES) ×3 IMPLANT
DRAPE UTILITY XL STRL (DRAPES) ×3 IMPLANT
ELECT REM PT RETURN 9FT ADLT (ELECTROSURGICAL) ×3
ELECTRODE REM PT RTRN 9FT ADLT (ELECTROSURGICAL) ×1 IMPLANT
GLOVE BIO SURGEON STRL SZ7 (GLOVE) ×3 IMPLANT
GLOVE BIOGEL PI IND STRL 7.5 (GLOVE) ×2 IMPLANT
GLOVE BIOGEL PI IND STRL 8.5 (GLOVE) ×1 IMPLANT
GLOVE BIOGEL PI INDICATOR 7.5 (GLOVE) ×4
GLOVE BIOGEL PI INDICATOR 8.5 (GLOVE) ×2
GLOVE ECLIPSE 8.0 STRL XLNG CF (GLOVE) ×3 IMPLANT
GOWN STRL REUS W/ TWL LRG LVL3 (GOWN DISPOSABLE) ×2 IMPLANT
GOWN STRL REUS W/TWL LRG LVL3 (GOWN DISPOSABLE) ×4
NEEDLE HYPO 25X1 1.5 SAFETY (NEEDLE) ×3 IMPLANT
NS IRRIG 1000ML POUR BTL (IV SOLUTION) IMPLANT
PACK BASIN DAY SURGERY FS (CUSTOM PROCEDURE TRAY) ×3 IMPLANT
PAD CLEANER CAUTERY TIP 5X5 (MISCELLANEOUS)
PENCIL BUTTON HOLSTER BLD 10FT (ELECTRODE) ×3 IMPLANT
SLEEVE SCD COMPRESS KNEE MED (MISCELLANEOUS) ×3 IMPLANT
SPONGE GAUZE 4X4 12PLY STER LF (GAUZE/BANDAGES/DRESSINGS) IMPLANT
SPONGE LAP 4X18 X RAY DECT (DISPOSABLE) ×3 IMPLANT
STRIP CLOSURE SKIN 1/2X4 (GAUZE/BANDAGES/DRESSINGS) IMPLANT
SUT MON AB 4-0 PC3 18 (SUTURE) ×3 IMPLANT
SUT VIC AB 3-0 54X BRD REEL (SUTURE) IMPLANT
SUT VIC AB 3-0 BRD 54 (SUTURE)
SUT VIC AB 3-0 SH 27 (SUTURE)
SUT VIC AB 3-0 SH 27X BRD (SUTURE) IMPLANT
SUT VICRYL 3-0 CR8 SH (SUTURE) IMPLANT
SUT VICRYL 4-0 PS2 18IN ABS (SUTURE) IMPLANT
SYR CONTROL 10ML LL (SYRINGE) ×3 IMPLANT
TOWEL OR 17X24 6PK STRL BLUE (TOWEL DISPOSABLE) ×3 IMPLANT
TOWEL OR NON WOVEN STRL DISP B (DISPOSABLE) ×3 IMPLANT

## 2015-10-02 NOTE — Transfer of Care (Signed)
Immediate Anesthesia Transfer of Care Note  Patient: Sherry Weber  Procedure(s) Performed: Procedure(s): REMOVAL OF RIGHT AXILLARY LYMPH NODE (Right)  Patient Location: PACU  Anesthesia Type:General  Level of Consciousness: awake, sedated and patient cooperative  Airway & Oxygen Therapy: Patient Spontanous Breathing and Patient connected to face mask oxygen  Post-op Assessment: Report given to RN and Post -op Vital signs reviewed and stable  Post vital signs: Reviewed and stable  Last Vitals:  Filed Vitals:   10/02/15 1057  BP: 112/49  Pulse: 58  Temp: 36.8 C  Resp: 20    Complications: No apparent anesthesia complications

## 2015-10-02 NOTE — Anesthesia Postprocedure Evaluation (Signed)
Anesthesia Post Note  Patient: Sherry Weber  Procedure(s) Performed: Procedure(s) (LRB): REMOVAL OF RIGHT AXILLARY LYMPH NODE (Right)  Anesthesia type: general  Patient location: PACU  Post pain: Pain level controlled  Post assessment: Patient's Cardiovascular Status Stable  Last Vitals:  Filed Vitals:   10/02/15 1507  BP: 122/79  Pulse: 55  Temp: 36.1 C  Resp: 14    Post vital signs: Reviewed and stable  Level of consciousness: sedated  Complications: No apparent anesthesia complications

## 2015-10-02 NOTE — Anesthesia Procedure Notes (Signed)
Procedure Name: LMA Insertion Date/Time: 10/02/2015 1:27 PM Performed by: Lyndee Leo Pre-anesthesia Checklist: Patient identified, Emergency Drugs available, Suction available and Patient being monitored Patient Re-evaluated:Patient Re-evaluated prior to inductionOxygen Delivery Method: Circle System Utilized Preoxygenation: Pre-oxygenation with 100% oxygen Intubation Type: IV induction Ventilation: Mask ventilation without difficulty LMA: LMA inserted LMA Size: 3.0 Number of attempts: 1 Airway Equipment and Method: Bite block Placement Confirmation: positive ETCO2 Tube secured with: Tape Dental Injury: Teeth and Oropharynx as per pre-operative assessment

## 2015-10-02 NOTE — Op Note (Signed)
Operative Note  Sherry Weber female 49 y.o. 10/02/2015  PREOPERATIVE DX:  Right axillary lymph node enlargement  POSTOPERATIVE DX:  Same  PROCEDURE:   Right axillary mass         Surgeon: [name]   Assistants: right axillary exploration and removal of mass  Anesthesia: General LMA anesthesia  Indications:   This is a 49 year female who had a lumpectomy in the upper outer quadrant of the right breast in the past for DCIS. She is on antiestrogen therapy. She noted a small nodule just superior to the incision in the right axilla which is palpable. She now presents for removal of that. Clinically, it was suspicious for an enlarged lymph node.    Procedure Detail:  She was seen in the holding area in the right axilla marked with my initials. She is brought to the operating room and placed upon the operative table given general anesthetic. Right axillary was sterilely prepped and draped. A timeout was performed.  Marcaine was infiltrated in the skin directly over the small palpable nodule. Transverse incision was made through skin and subcutaneous tissues. I carried this down to the pectoral fascia. I felt some atypical fatty tissue which I removed. However the mass appeared to be somewhat superior to my incision. I continued to carefully dissecting the subcutaneous tissue and saw a lobulated mass mostly surrounded by fatty type tissue. This was approximated the size thatt was palpated and in the area. This was removed. All the tissue was sent to pathology.  The wound was inspected. Once hemostasis was adequate, subcutaneous tissue was closed with interrupted 4-0 Monocryl stitches. The skin was closed with running 4-0 Monocryl subcuticular stitch. Steri-Strips and sterile dressing were applied.  She tolerated the procedure well without apparent complications. She was taken to the recovery room in satisfactory condition.   Estimated Blood Loss:  less than 100 mL          Specimens: Right axillary mass        Complications:  * No complications entered in OR log *         Disposition: PACU - hemodynamically stable.         Condition: stable

## 2015-10-02 NOTE — Discharge Instructions (Addendum)
[facility] Phone Number [phone]  AXILLARY SURGERY : POST OP INSTRUCTIONS  Always review your discharge instruction sheet given to you by the facility where your surgery was performed.  IF YOU HAVE DISABILITY OR FAMILY LEAVE FORMS, YOU MUST BRING THEM TO THE OFFICE FOR PROCESSING.  DO NOT GIVE THEM TO YOUR DOCTOR.  1. A prescription for pain medication may be given to you upon discharge.  Take your pain medication as prescribed, if needed.  If narcotic pain medicine is not needed, then you may take acetaminophen (Tylenol) or ibuprofen (Advil) as needed. 2. Take your usually prescribed medications unless otherwise directed 3. If you need a refill on your pain medication, please contact your pharmacy.  They will contact our office to request authorization.  Prescriptions will not be filled after 5pm or on week-ends. 4. You should eat very light the first 24 hours after surgery, such as soup, crackers, pudding, etc.  Resume your normal diet the day after surgery. 5. Most patients will experience some swelling and bruising in the breast.  Ice packs and a good support bra will help.  Swelling and bruising can take several days to resolve.  6. It is common to experience some constipation if taking pain medication after surgery.  Increasing fluid intake and taking a stool softener will usually help or prevent this problem from occurring.  A mild laxative (Milk of Magnesia or Miralax) should be taken according to package directions if there are no bowel movements after 48 hours. 7. Unless discharge instructions indicate otherwise, you may remove your bandages 72 hours after surgery, and you may shower at that time.  You may have steri-strips (small skin tapes) in place directly over the incision.  These strips should be left on the skin for 14 days.  If your surgeon used skin glue on the incision, you may shower in 24 hours.  The glue will flake off over the next 2-3 weeks.  Any  sutures or staples will be removed at the office during your follow-up visit. a. ACTIVITIES:  You may resume light daily activities (gradually increasing) beginning the next day.  You may drive when you no longer are taking prescription pain medication, you can comfortably wear a seatbelt, and you can safely maneuver your car and apply brakes. b. RETURN TO WORK:  In 3-5 days.______________________________________________________________________________________ 8. You should see your doctor in the office for a follow-up appointment approximately two weeks after your surgery.    Expect your pathology report 3 business days after your surgery.  You may call to check if you do not hear from [place] after three days. 9. OTHER INSTRUCTIONS: _______________________________________________________________________________________________ _____________________________________________________________________________________________________________________________________ _____________________________________________________________________________________________________________________________________ _____________________________________________________________________________________________________________________________________  WHEN TO CALL YOUR DOCTOR: 1. Fever over 101.0 2. Nausea and/or vomiting. 3. Extreme swelling or bruising. 4. Continued bleeding from incision. 5. Increased pain, redness, or drainage from the incision.  The clinic staff is available to answer your questions during regular business hours.  Please dont hesitate to call and ask to speak to one of the nurses for clinical concerns.  If you have a medical emergency, go to the nearest emergency room or call 911.  A surgeon from [facility] is always on call at the hospital.  For further questions, please visit [email or web address]     Post Anesthesia Home Care Instructions  Activity: Get plenty of rest for the  remainder of the day. A responsible adult should stay with you for 24 hours following the procedure.  For the next 24  hours, DO NOT: -Drive a car -Paediatric nurse -Drink alcoholic beverages -Take any medication unless instructed by your physician -Make any legal decisions or sign important papers.  Meals: Start with liquid foods such as gelatin or soup. Progress to regular foods as tolerated. Avoid greasy, spicy, heavy foods. If nausea and/or vomiting occur, drink only clear liquids until the nausea and/or vomiting subsides. Call your physician if vomiting continues.  Special Instructions/Symptoms: Your throat may feel dry or sore from the anesthesia or the breathing tube placed in your throat during surgery. If this causes discomfort, gargle with warm salt water. The discomfort should disappear within 24 hours.  If you had a scopolamine patch placed behind your ear for the management of post- operative nausea and/or vomiting:  1. The medication in the patch is effective for 72 hours, after which it should be removed.  Wrap patch in a tissue and discard in the trash. Wash hands thoroughly with soap and water. 2. You may remove the patch earlier than 72 hours if you experience unpleasant side effects which may include dry mouth, dizziness or visual disturbances. 3. Avoid touching the patch. Wash your hands with soap and water after contact with the patch.

## 2015-10-02 NOTE — Interval H&P Note (Signed)
History and Physical Interval Note:  10/02/2015 1:11 PM  Sherry Weber  has presented today for surgery, with the diagnosis of Right Axillary Lymph Node  The various methods of treatment have been discussed with the patient and family. After consideration of risks, benefits and other options for treatment, the patient has consented to  Procedure(s): REMOVAL OF RIGHT AXILLARY LYMPH NODE (Right) as a surgical intervention .  The patient's history has been reviewed, patient examined, no change in status, stable for surgery.  I have reviewed the patient's chart and labs.  Questions were answered to the patient's satisfaction.     Sherry Weber Lenna Sciara

## 2015-10-02 NOTE — Anesthesia Preprocedure Evaluation (Signed)
Anesthesia Evaluation  Patient identified by MRN, date of birth, ID band Patient awake    Reviewed: Allergy & Precautions, NPO status , Patient's Chart, lab work & pertinent test results  History of Anesthesia Complications (+) PONV  Airway Mallampati: I  TM Distance: >3 FB Neck ROM: Full    Dental   Pulmonary    Pulmonary exam normal        Cardiovascular Normal cardiovascular exam     Neuro/Psych    GI/Hepatic   Endo/Other    Renal/GU      Musculoskeletal   Abdominal   Peds  Hematology   Anesthesia Other Findings   Reproductive/Obstetrics                             Anesthesia Physical Anesthesia Plan  ASA: II  Anesthesia Plan: General   Post-op Pain Management:    Induction: Intravenous  Airway Management Planned: LMA  Additional Equipment:   Intra-op Plan:   Post-operative Plan: Extubation in OR  Informed Consent:   Plan Discussed with: CRNA and Surgeon  Anesthesia Plan Comments:         Anesthesia Quick Evaluation

## 2015-10-02 NOTE — H&P (View-Only) (Signed)
Sherry Weber 09/10/2015 2:31 PM Location: [facility] Patient #: [id number] DOB: 1966/03/09 Married / Language: English / Race: Refused to Report/Unreported Female History of Present Illness Sherry Hollingshead MD; 09/10/2015 3:07 PM) Patient words: breast f/u.  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 Margins are clear. Estrogen receptor and progesterone receptor positive.  History: She is here for long-term follow-up visit. She is on tamoxifen. She had a TAH/BSO April 01, 2015. Has a firm area in the incision she would like me to look at. Since I saw her last, she noticed a small mass in the right axilla. Mammogram performed June 01, 2015 demonstrated no evidence of malignancy.  Exam: General- Is in NAD. Right breast-a well-healed upper outer quadrant scar with some indentation. No palpable masses.  Left breast-no palpable masses or suspicious skin changes.  Lymph nodes-there is a 1 cm palpable soft tissue mass in the right axilla consistent with lymph node clinically. It is near the lumpectomy incision. This was not present previously. No supraclavicular or left axillary adenopathy.  Allergies (Sonya Bynum, CMA; 09/10/2015 2:32 PM) No Known Drug Allergies 01/14/2015  Medication History (Sonya Bynum, CMA; 09/10/2015 2:32 PM) Tylenol Cold Multi-Symptom (5-10-200-325MG  Tablet, Oral as needed) Active. Tamoxifen Citrate (20MG  Tablet, Oral) Active. Medications Reconciled    Vitals (Sonya Bynum CMA; 09/10/2015 2:32 PM) 09/10/2015 2:31 PM Weight: 109 lb Height: 63in Body Surface Area: 1.48 m Body Mass Index: 19.31 kg/m Temp.: 97.70F(Temporal)  Pulse: 77 (Regular)  BP: 128/80 (Sitting, Left Arm, Standard)     Assessment & Plan Sherry Hollingshead MD; 09/10/2015 3:09 PM)  DCIS (DUCTAL CARCINOMA IN  SITU), RIGHT (D05.11) Impression: No clinical or radiographic evidence of recurrence in the breast.  PALPABLE LYMPH NODE (R59.9) Impression: This is new since her last visit.  Plan: I recommended removal of the right axillary lymph node. We discussed the procedure, rationale, and risks. Risks include but are not limited to bleeding, infection, wound healing problems, anesthesia. She understands all this and agrees with the plan.  Jackolyn Confer, MD

## 2015-10-05 ENCOUNTER — Encounter (HOSPITAL_BASED_OUTPATIENT_CLINIC_OR_DEPARTMENT_OTHER): Payer: Self-pay | Admitting: General Surgery

## 2015-10-15 ENCOUNTER — Telehealth: Payer: Self-pay | Admitting: Hematology and Oncology

## 2015-10-15 NOTE — Telephone Encounter (Signed)
Returned patients call to reschedule her  appointment °

## 2015-12-21 ENCOUNTER — Ambulatory Visit: Payer: Managed Care, Other (non HMO) | Admitting: Hematology and Oncology

## 2015-12-22 ENCOUNTER — Telehealth: Payer: Self-pay | Admitting: Hematology and Oncology

## 2015-12-22 NOTE — Telephone Encounter (Signed)
Returned patient's call to reschedule appt.

## 2015-12-24 ENCOUNTER — Ambulatory Visit: Payer: Managed Care, Other (non HMO) | Admitting: Hematology and Oncology

## 2016-01-06 NOTE — Assessment & Plan Note (Signed)
Low-grade DCIS involving the right breast: ER/PR positive status post right breast lumpectomy and radiation therapy. Started tamoxifen therapy on Oct 2015 S/P TAH BSO April 2016  Toxicities to tamoxifen: patient has no side effects of tamoxifen.  Breast Cancer Surveillance: 1. Breast exam 06/15/15: Normal 2. Mammogram No abnormalities. Postsurgical changes. Breast Density Category I recommended that she get 3-D mammograms for surveillance. 3. Left axillary mass excision 10/02/15 : Benign fibroadipose tissue with foreign body giant cell reaction  F/U in 6 months

## 2016-01-07 ENCOUNTER — Telehealth: Payer: Self-pay | Admitting: Hematology and Oncology

## 2016-01-07 ENCOUNTER — Ambulatory Visit (HOSPITAL_BASED_OUTPATIENT_CLINIC_OR_DEPARTMENT_OTHER): Payer: Managed Care, Other (non HMO) | Admitting: Hematology and Oncology

## 2016-01-07 ENCOUNTER — Encounter: Payer: Self-pay | Admitting: Hematology and Oncology

## 2016-01-07 VITALS — BP 119/72 | HR 76 | Temp 97.6°F | Resp 18 | Ht 63.0 in | Wt 117.0 lb

## 2016-01-07 DIAGNOSIS — Z79810 Long term (current) use of selective estrogen receptor modulators (SERMs): Secondary | ICD-10-CM

## 2016-01-07 DIAGNOSIS — Z170 Estrogen receptor positive status [ER+]: Secondary | ICD-10-CM | POA: Diagnosis not present

## 2016-01-07 DIAGNOSIS — D0511 Intraductal carcinoma in situ of right breast: Secondary | ICD-10-CM

## 2016-01-07 NOTE — Progress Notes (Signed)
Patient Care Team: Thurnell Lose, MD as PCP - General (Obstetrics and Gynecology)  DIAGNOSIS: Ductal carcinoma in situ (DCIS) of right breast   Staging form: Breast, AJCC 7th Edition     Pathologic: Stage Unknown (Tis (DCIS), NX, cM0) - Signed by Thea Silversmith, MD on 07/31/2014   SUMMARY OF ONCOLOGIC HISTORY: Treatment summary: Right breast lumpectomy: Low-grade DCIS; status post radiation, currently on tamoxifen started October 2015  CHIEF COMPLIANT: follow-up on tamoxifen  INTERVAL HISTORY: Sherry Weber is a 50 year old with above-mentioned history of right breast DCIS currently on tamoxifen therapy and is here to follow-up. She reports no major problems or concerns with tamoxifen therapy. She denies any hot flashes or myalgias. She is very athletic and had run 4 miles coming from her house to the clinic. Denies any lumps or nodules. Last year she had a small nodule under the right arm which was removed and it came back as benign. Since then she does not have any problems with numbness and pain that she used to have in the right arm.  REVIEW OF SYSTEMS:   Constitutional: Denies fevers, chills or abnormal weight loss Eyes: Denies blurriness of vision Ears, nose, mouth, throat, and face: Denies mucositis or sore throat Respiratory: Denies cough, dyspnea or wheezes Cardiovascular: Denies palpitation, chest discomfort Gastrointestinal:  Denies nausea, heartburn or change in bowel habits Skin: Denies abnormal skin rashes Lymphatics: Denies new lymphadenopathy or easy bruising Neurological:Denies numbness, tingling or new weaknesses Behavioral/Psych: Mood is stable, no new changes  Extremities: No lower extremity edema Breast:  denies any pain or lumps or nodules in either breasts All other systems were reviewed with the patient and are negative.  I have reviewed the past medical history, past surgical history, social history and family history with the patient and they are  unchanged from previous note.  ALLERGIES:  has No Known Allergies.  MEDICATIONS:  Current Outpatient Prescriptions  Medication Sig Dispense Refill  . acetaminophen (TYLENOL) 325 MG tablet Take 650 mg by mouth every 6 (six) hours as needed for mild pain.     . tamoxifen (NOLVADEX) 20 MG tablet Take 1 tablet (20 mg total) by mouth daily. 90 tablet 3  . HYDROcodone-acetaminophen (NORCO/VICODIN) 5-325 MG tablet Take 1-2 tablets by mouth every 4 (four) hours as needed for moderate pain or severe pain. (Patient not taking: Reported on 01/07/2016) 40 tablet 0   No current facility-administered medications for this visit.    PHYSICAL EXAMINATION: ECOG PERFORMANCE STATUS: 0 - Asymptomatic  Filed Vitals:   01/07/16 1118  BP: 119/72  Pulse: 76  Temp: 97.6 F (36.4 C)  Resp: 18   Filed Weights   01/07/16 1118  Weight: 117 lb (53.071 kg)    GENERAL:alert, no distress and comfortable SKIN: skin color, texture, turgor are normal, no rashes or significant lesions EYES: normal, Conjunctiva are pink and non-injected, sclera clear OROPHARYNX:no exudate, no erythema and lips, buccal mucosa, and tongue normal  NECK: supple, thyroid normal size, non-tender, without nodularity LYMPH:  no palpable lymphadenopathy in the cervical, axillary or inguinal LUNGS: clear to auscultation and percussion with normal breathing effort HEART: regular rate & rhythm and no murmurs and no lower extremity edema ABDOMEN:abdomen soft, non-tender and normal bowel sounds MUSCULOSKELETAL:no cyanosis of digits and no clubbing  NEURO: alert & oriented x 3 with fluent speech, no focal motor/sensory deficits EXTREMITIES: No lower extremity edema BREAST: No palpable masses or nodules in either right or left breasts. No palpable axillary supraclavicular or infraclavicular adenopathy no  breast tenderness or nipple discharge. (exam performed in the presence of a chaperone)  LABORATORY DATA:  I have reviewed the data as listed    Chemistry      Component Value Date/Time   NA 141 09/29/2015 1500   K 3.8 09/29/2015 1500   CL 106 09/29/2015 1500   CO2 31 09/29/2015 1500   BUN 18 09/29/2015 1500   CREATININE 0.73 09/29/2015 1500      Component Value Date/Time   CALCIUM 8.9 09/29/2015 1500   ALKPHOS 43 09/29/2015 1500   AST 42* 09/29/2015 1500   ALT 41 09/29/2015 1500   BILITOT 0.8 09/29/2015 1500       Lab Results  Component Value Date   WBC 3.6* 09/29/2015   HGB 12.7 09/29/2015   HCT 38.3 09/29/2015   MCV 95.3 09/29/2015   PLT 243 09/29/2015   NEUTROABS 2.1 09/29/2015     ASSESSMENT & PLAN:  Ductal carcinoma in situ (DCIS) of right breast Low-grade DCIS involving the right breast: ER/PR positive status post right breast lumpectomy and radiation therapy. Started tamoxifen therapy on Oct 2015 S/P TAH BSO April 2016 Resection of the right axillary mass came back benign  Toxicities to tamoxifen: patient has no side effects of tamoxifen.  Breast Cancer Surveillance: 1. Breast exam 01/07/2016: no consent for recurrent breast cancer. 2. Mammogram No abnormalities. Postsurgical changes. Breast Density Category I recommended that she get 3-D mammograms for surveillance. 3. Left axillary mass excision 10/02/15 : Benign fibroadipose tissue with foreign body giant cell reaction  F/U in 6 months and after that we will see her once a year   No orders of the defined types were placed in this encounter.   The patient has a good understanding of the overall plan. she agrees with it. she will call with any problems that may develop before the next visit here.   Rulon Eisenmenger, MD 01/07/2016

## 2016-01-07 NOTE — Telephone Encounter (Signed)
Appointments made and avs printed for patient °

## 2016-06-20 ENCOUNTER — Other Ambulatory Visit: Payer: Self-pay | Admitting: Hematology and Oncology

## 2016-06-20 NOTE — Telephone Encounter (Signed)
Chart reviewed.

## 2016-07-07 ENCOUNTER — Ambulatory Visit: Payer: Managed Care, Other (non HMO) | Admitting: Hematology and Oncology

## 2016-07-14 ENCOUNTER — Encounter: Payer: Self-pay | Admitting: Hematology and Oncology

## 2016-07-14 ENCOUNTER — Ambulatory Visit (HOSPITAL_BASED_OUTPATIENT_CLINIC_OR_DEPARTMENT_OTHER): Payer: Managed Care, Other (non HMO) | Admitting: Hematology and Oncology

## 2016-07-14 ENCOUNTER — Telehealth: Payer: Self-pay | Admitting: Hematology and Oncology

## 2016-07-14 DIAGNOSIS — Z79810 Long term (current) use of selective estrogen receptor modulators (SERMs): Secondary | ICD-10-CM

## 2016-07-14 DIAGNOSIS — Z170 Estrogen receptor positive status [ER+]: Secondary | ICD-10-CM | POA: Diagnosis not present

## 2016-07-14 DIAGNOSIS — D0511 Intraductal carcinoma in situ of right breast: Secondary | ICD-10-CM | POA: Diagnosis not present

## 2016-07-14 NOTE — Progress Notes (Signed)
Patient Care Team: Thurnell Lose, MD as PCP - General (Obstetrics and Gynecology)  DIAGNOSIS: Ductal carcinoma in situ (DCIS) of right breast   Staging form: Breast, AJCC 7th Edition   - Pathologic: Stage Unknown (Tis (DCIS), NX, cM0) - Signed by Thea Silversmith, MD on 07/31/2014  SUMMARY OF ONCOLOGIC HISTORY: Treatment summary: Right breast lumpectomy: Low-grade DCIS; status post radiation, currently on tamoxifen started October 2015  CHIEF COMPLIANT: follow-up on tamoxifen  INTERVAL HISTORY: Sherry Weber is a 50 year old with above-mentioned history of right breast DCIS treated with lumpectomy and radiation and is currently on tamoxifen therapy. She did denies any hot flashes or myalgias. She stays very active with running as well as exercising in the gym 3 times a week.  REVIEW OF SYSTEMS:   Constitutional: Denies fevers, chills or abnormal weight loss Eyes: Denies blurriness of vision Ears, nose, mouth, throat, and face: Denies mucositis or sore throat Respiratory: Denies cough, dyspnea or wheezes Cardiovascular: Denies palpitation, chest discomfort Gastrointestinal:  Denies nausea, heartburn or change in bowel habits Skin: Denies abnormal skin rashes Lymphatics: Denies new lymphadenopathy or easy bruising Neurological:Denies numbness, tingling or new weaknesses Behavioral/Psych: Mood is stable, no new changes  Extremities: No lower extremity edema Breast:  denies any pain or lumps or nodules in either breasts All other systems were reviewed with the patient and are negative.  I have reviewed the past medical history, past surgical history, social history and family history with the patient and they are unchanged from previous note.  ALLERGIES:  has No Known Allergies.  MEDICATIONS:  Current Outpatient Prescriptions  Medication Sig Dispense Refill  . acetaminophen (TYLENOL) 325 MG tablet Take 650 mg by mouth every 6 (six) hours as needed for mild pain.     Marland Kitchen  HYDROcodone-acetaminophen (NORCO/VICODIN) 5-325 MG tablet Take 1-2 tablets by mouth every 4 (four) hours as needed for moderate pain or severe pain. (Patient not taking: Reported on 01/07/2016) 40 tablet 0  . tamoxifen (NOLVADEX) 20 MG tablet TAKE 1 TABLET BY MOUTH DAILY 90 tablet 3   No current facility-administered medications for this visit.     PHYSICAL EXAMINATION: ECOG PERFORMANCE STATUS: 0 - Asymptomatic  Vitals:   07/14/16 1440  BP: 102/60  Pulse: 67  Resp: 18  Temp: 97.8 F (36.6 C)   Filed Weights   07/14/16 1440  Weight: 115 lb (52.2 kg)    GENERAL:alert, no distress and comfortable SKIN: skin color, texture, turgor are normal, no rashes or significant lesions EYES: normal, Conjunctiva are pink and non-injected, sclera clear OROPHARYNX:no exudate, no erythema and lips, buccal mucosa, and tongue normal  NECK: supple, thyroid normal size, non-tender, without nodularity LYMPH:  no palpable lymphadenopathy in the cervical, axillary or inguinal LUNGS: clear to auscultation and percussion with normal breathing effort HEART: regular rate & rhythm and no murmurs and no lower extremity edema ABDOMEN:abdomen soft, non-tender and normal bowel sounds MUSCULOSKELETAL:no cyanosis of digits and no clubbing  NEURO: alert & oriented x 3 with fluent speech, no focal motor/sensory deficits EXTREMITIES: No lower extremity edema BREAST: No palpable masses or nodules in either right or left breasts. No palpable axillary supraclavicular or infraclavicular adenopathy no breast tenderness or nipple discharge. (exam performed in the presence of a chaperone)  LABORATORY DATA:  I have reviewed the data as listed   Chemistry      Component Value Date/Time   NA 141 09/29/2015 1500   K 3.8 09/29/2015 1500   CL 106 09/29/2015 1500   CO2 31  09/29/2015 1500   BUN 18 09/29/2015 1500   CREATININE 0.73 09/29/2015 1500      Component Value Date/Time   CALCIUM 8.9 09/29/2015 1500   ALKPHOS 43  09/29/2015 1500   AST 42 (H) 09/29/2015 1500   ALT 41 09/29/2015 1500   BILITOT 0.8 09/29/2015 1500       Lab Results  Component Value Date   WBC 3.6 (L) 09/29/2015   HGB 12.7 09/29/2015   HCT 38.3 09/29/2015   MCV 95.3 09/29/2015   PLT 243 09/29/2015   NEUTROABS 2.1 09/29/2015     ASSESSMENT & PLAN:  Ductal carcinoma in situ (DCIS) of right breast Low-grade DCIS involving the right breast: ER/PR positive status post right breast lumpectomy and radiation therapy. Started tamoxifen therapy on Oct 2015 S/P TAH BSO April 2016 Resection of the right axillary mass came back benign  Toxicities to tamoxifen: patient has no side effects of tamoxifen.  Breast Cancer Surveillance: 1. Breast exam 01/07/2016: no consent for recurrent breast cancer. 2. Mammogram No abnormalities. Postsurgical changes. Breast Density Category I recommended that she get 3-D mammograms for surveillance. 3. Left axillary mass excision 10/02/15 : Benign fibroadipose tissue with foreign body giant cell reaction  F/U in one year   No orders of the defined types were placed in this encounter.  The patient has a good understanding of the overall plan. she agrees with it. she will call with any problems that may develop before the next visit here.   Rulon Eisenmenger, MD 07/14/16

## 2016-07-14 NOTE — Telephone Encounter (Signed)
appt made and avs printed °

## 2016-07-14 NOTE — Assessment & Plan Note (Signed)
Low-grade DCIS involving the right breast: ER/PR positive status post right breast lumpectomy and radiation therapy. Started tamoxifen therapy on Oct 2015 S/P TAH BSO April 2016 Resection of the right axillary mass came back benign  Toxicities to tamoxifen: patient has no side effects of tamoxifen.  Breast Cancer Surveillance: 1. Breast exam 01/07/2016: no consent for recurrent breast cancer. 2. Mammogram No abnormalities. Postsurgical changes. Breast Density Category I recommended that she get 3-D mammograms for surveillance. 3. Left axillary mass excision 10/02/15 : Benign fibroadipose tissue with foreign body giant cell reaction  F/U in one year

## 2016-09-05 ENCOUNTER — Other Ambulatory Visit: Payer: Self-pay | Admitting: Hematology and Oncology

## 2016-09-05 DIAGNOSIS — D0511 Intraductal carcinoma in situ of right breast: Secondary | ICD-10-CM

## 2016-09-05 NOTE — Telephone Encounter (Signed)
Chart reviewed.

## 2017-06-19 ENCOUNTER — Other Ambulatory Visit: Payer: Self-pay | Admitting: Hematology and Oncology

## 2017-07-09 ENCOUNTER — Telehealth: Payer: Self-pay

## 2017-07-09 NOTE — Telephone Encounter (Signed)
Called and left a message with new appt due to call day  Sherry Weber

## 2017-07-13 ENCOUNTER — Ambulatory Visit: Payer: Managed Care, Other (non HMO) | Admitting: Hematology and Oncology

## 2017-07-24 ENCOUNTER — Ambulatory Visit (HOSPITAL_BASED_OUTPATIENT_CLINIC_OR_DEPARTMENT_OTHER): Payer: Commercial Managed Care - PPO | Admitting: Hematology and Oncology

## 2017-07-24 ENCOUNTER — Encounter: Payer: Self-pay | Admitting: Hematology and Oncology

## 2017-07-24 DIAGNOSIS — D0511 Intraductal carcinoma in situ of right breast: Secondary | ICD-10-CM

## 2017-07-24 DIAGNOSIS — Z79810 Long term (current) use of selective estrogen receptor modulators (SERMs): Secondary | ICD-10-CM

## 2017-07-24 DIAGNOSIS — Z170 Estrogen receptor positive status [ER+]: Secondary | ICD-10-CM

## 2017-07-24 NOTE — Progress Notes (Signed)
Patient Care Team: Thurnell Lose, MD as PCP - General (Obstetrics and Gynecology)  DIAGNOSIS:  Encounter Diagnosis  Name Primary?  . Ductal carcinoma in situ (DCIS) of right breast    CHIEF COMPLIANT: Follow-up on tamoxifen therapy  INTERVAL HISTORY: Sherry Weber is a 51 year old with above-mentioned history of right breast DCIS who is currently on tamoxifen therapy for the past 3 years. She is tolerating it fairly well. Recently she has noted increased hot flashes. She also has mild discomfort underneath the right breast ever since the surgery.  REVIEW OF SYSTEMS:   Constitutional: Denies fevers, chills or abnormal weight loss Eyes: Denies blurriness of vision Ears, nose, mouth, throat, and face: Denies mucositis or sore throat Respiratory: Denies cough, dyspnea or wheezes Cardiovascular: Denies palpitation, chest discomfort Gastrointestinal:  Denies nausea, heartburn or change in bowel habits Skin: Denies abnormal skin rashes Lymphatics: Denies new lymphadenopathy or easy bruising Neurological:Denies numbness, tingling or new weaknesses Behavioral/Psych: Mood is stable, no new changes  Extremities: No lower extremity edema Breast: Mild discomfort underneath the right breast All other systems were reviewed with the patient and are negative.  I have reviewed the past medical history, past surgical history, social history and family history with the patient and they are unchanged from previous note.  ALLERGIES:  has No Known Allergies.  MEDICATIONS:  Current Outpatient Prescriptions  Medication Sig Dispense Refill  . acetaminophen (TYLENOL) 325 MG tablet Take 650 mg by mouth every 6 (six) hours as needed for mild pain.     Marland Kitchen HYDROcodone-acetaminophen (NORCO/VICODIN) 5-325 MG tablet Take 1-2 tablets by mouth every 4 (four) hours as needed for moderate pain or severe pain. (Patient not taking: Reported on 01/07/2016) 40 tablet 0  . tamoxifen (NOLVADEX) 20 MG tablet TAKE 1  TABLET BY MOUTH DAILY 90 tablet 3   No current facility-administered medications for this visit.     PHYSICAL EXAMINATION: ECOG PERFORMANCE STATUS: 1 - Symptomatic but completely ambulatory  Vitals:   07/24/17 1431  BP: (!) 105/48  Pulse: (!) 59  Resp: 18  Temp: 97.8 F (36.6 C)  SpO2: 100%   Filed Weights   07/24/17 1431  Weight: 120 lb 14.4 oz (54.8 kg)    GENERAL:alert, no distress and comfortable SKIN: skin color, texture, turgor are normal, no rashes or significant lesions EYES: normal, Conjunctiva are pink and non-injected, sclera clear OROPHARYNX:no exudate, no erythema and lips, buccal mucosa, and tongue normal  NECK: supple, thyroid normal size, non-tender, without nodularity LYMPH:  no palpable lymphadenopathy in the cervical, axillary or inguinal LUNGS: clear to auscultation and percussion with normal breathing effort HEART: regular rate & rhythm and no murmurs and no lower extremity edema ABDOMEN:abdomen soft, non-tender and normal bowel sounds MUSCULOSKELETAL:no cyanosis of digits and no clubbing  NEURO: alert & oriented x 3 with fluent speech, no focal motor/sensory deficits EXTREMITIES: No lower extremity edema BREAST: No palpable masses or nodules in either right or left breasts. No palpable axillary supraclavicular or infraclavicular adenopathy no breast tenderness or nipple discharge. (exam performed in the presence of a chaperone)  LABORATORY DATA:  I have reviewed the data as listed   Chemistry      Component Value Date/Time   NA 141 09/29/2015 1500   K 3.8 09/29/2015 1500   CL 106 09/29/2015 1500   CO2 31 09/29/2015 1500   BUN 18 09/29/2015 1500   CREATININE 0.73 09/29/2015 1500      Component Value Date/Time   CALCIUM 8.9 09/29/2015 1500  ALKPHOS 43 09/29/2015 1500   AST 42 (H) 09/29/2015 1500   ALT 41 09/29/2015 1500   BILITOT 0.8 09/29/2015 1500       Lab Results  Component Value Date   WBC 3.6 (L) 09/29/2015   HGB 12.7 09/29/2015     HCT 38.3 09/29/2015   MCV 95.3 09/29/2015   PLT 243 09/29/2015   NEUTROABS 2.1 09/29/2015    ASSESSMENT & PLAN:  Ductal carcinoma in situ (DCIS) of right breast Low-grade DCIS involving the right breast: ER/PR positive status post right breast lumpectomy and radiation therapy. Started tamoxifen therapy on Oct 2015 S/P TAH BSO April 2016 Resection of the right axillary mass came back benign  Toxicities to tamoxifen: patient has no side effects of tamoxifen.  Breast Cancer Surveillance: 1. Breast exam 07/24/2017: no concern for recurrent breast cancer. 2. Mammogram : Requesting a copy 3. Left axillary mass excision 10/02/15 : Benign fibroadipose tissue with foreign body giant cell reaction  F/U in one year   I spent 25 minutes talking to the patient of which more than half was spent in counseling and coordination of care.  No orders of the defined types were placed in this encounter.  The patient has a good understanding of the overall plan. she agrees with it. she will call with any problems that may develop before the next visit here.   Rulon Eisenmenger, MD 07/24/17

## 2017-07-24 NOTE — Assessment & Plan Note (Signed)
Low-grade DCIS involving the right breast: ER/PR positive status post right breast lumpectomy and radiation therapy. Started tamoxifen therapy on Oct 2015 S/P TAH BSO April 2016 Resection of the right axillary mass came back benign  Toxicities to tamoxifen: patient has no side effects of tamoxifen.  Breast Cancer Surveillance: 1. Breast exam 07/24/2017: no concern for recurrent breast cancer. 2. Mammogram : Requesting a copy 3. Left axillary mass excision 10/02/15 : Benign fibroadipose tissue with foreign body giant cell reaction  F/U in one year

## 2018-04-27 ENCOUNTER — Other Ambulatory Visit: Payer: Self-pay

## 2018-04-27 DIAGNOSIS — D0511 Intraductal carcinoma in situ of right breast: Secondary | ICD-10-CM

## 2018-04-27 NOTE — Progress Notes (Signed)
Pt called for diagnostic  mammogram to be ordered for her annual.  Called pt and let her know it has been reordered and number given to [facility] imaging [phone] to make her appt.

## 2018-06-04 ENCOUNTER — Ambulatory Visit
Admission: RE | Admit: 2018-06-04 | Discharge: 2018-06-04 | Disposition: A | Discharge status: Home / Self Care | Payer: Commercial Managed Care - PPO | Source: Ambulatory Visit | Attending: Hematology and Oncology | Admitting: Hematology and Oncology

## 2018-06-04 DIAGNOSIS — D0511 Intraductal carcinoma in situ of right breast: Secondary | ICD-10-CM

## 2018-06-11 ENCOUNTER — Other Ambulatory Visit: Payer: Self-pay | Admitting: Hematology and Oncology

## 2018-06-13 ENCOUNTER — Telehealth: Payer: Self-pay | Admitting: Hematology and Oncology

## 2018-06-13 NOTE — Telephone Encounter (Signed)
Called patient regarding date change. She is not available 24-31st

## 2018-07-24 ENCOUNTER — Ambulatory Visit: Payer: Commercial Managed Care - PPO | Admitting: Hematology and Oncology

## 2018-08-07 ENCOUNTER — Ambulatory Visit: Payer: Commercial Managed Care - PPO | Admitting: Hematology and Oncology

## 2018-08-14 ENCOUNTER — Inpatient Hospital Stay: Payer: Commercial Managed Care - PPO | Attending: Hematology and Oncology | Admitting: Hematology and Oncology

## 2018-08-14 ENCOUNTER — Telehealth: Payer: Self-pay | Admitting: Hematology and Oncology

## 2018-08-14 DIAGNOSIS — D0511 Intraductal carcinoma in situ of right breast: Secondary | ICD-10-CM | POA: Diagnosis not present

## 2018-08-14 DIAGNOSIS — Z79810 Long term (current) use of selective estrogen receptor modulators (SERMs): Secondary | ICD-10-CM | POA: Insufficient documentation

## 2018-08-14 DIAGNOSIS — Z170 Estrogen receptor positive status [ER+]: Secondary | ICD-10-CM | POA: Diagnosis not present

## 2018-08-14 MED ORDER — TAMOXIFEN CITRATE 20 MG PO TABS: 20.0000 mg | ORAL_TABLET | Freq: Every day | ORAL | 3 refills | Status: DC

## 2018-08-14 NOTE — Assessment & Plan Note (Signed)
Low-grade DCIS involving the right breast: ER/PR positive status post right breast lumpectomy and radiation therapy. Started tamoxifen therapy on Oct 2015 S/P TAH BSO April 2016 Resection of the right axillary mass came back benign  Toxicities to tamoxifen: patient has no side effects of tamoxifen.  Breast Cancer Surveillance: 1. Breast exam  08/14/2018: no concern for recurrent breast cancer. 2. Mammogram 06/04/2018 : No mammographic evidence of malignancy.  Breast density category C 3. Left axillary mass excision 10/02/15 : Benign fibroadipose tissue with foreign body giant cell reaction  F/U in one year

## 2018-08-14 NOTE — Progress Notes (Signed)
Patient Care Team: Thurnell Lose, MD as PCP - General (Obstetrics and Gynecology)  DIAGNOSIS:  Encounter Diagnosis  Name Primary?  . Ductal carcinoma in situ (DCIS) of right breast    CHIEF COMPLIANT: Follow-up of DCIS on tamoxifen therapy  INTERVAL HISTORY: Sherry Weber is a 52 year old with above-mentioned history of DCIS of the right breast who is currently on tamoxifen and appears to be tolerating it extremely well.  She denies any side effects from tamoxifen.  Denies any lumps or nodules in the breast.  REVIEW OF SYSTEMS:   Constitutional: Denies fevers, chills or abnormal weight loss Eyes: Denies blurriness of vision Ears, nose, mouth, throat, and face: Denies mucositis or sore throat Respiratory: Denies cough, dyspnea or wheezes Cardiovascular: Denies palpitation, chest discomfort Gastrointestinal:  Denies nausea, heartburn or change in bowel habits Skin: Denies abnormal skin rashes Lymphatics: Denies new lymphadenopathy or easy bruising Neurological:Denies numbness, tingling or new weaknesses Behavioral/Psych: Mood is stable, no new changes  Extremities: No lower extremity edema Breast:  denies any pain or lumps or nodules in either breasts All other systems were reviewed with the patient and are negative.  I have reviewed the past medical history, past surgical history, social history and family history with the patient and they are unchanged from previous note.  ALLERGIES:  has No Known Allergies.  MEDICATIONS:  Current Outpatient Medications  Medication Sig Dispense Refill  . acetaminophen (TYLENOL) 325 MG tablet Take 650 mg by mouth every 6 (six) hours as needed for mild pain.     . tamoxifen (NOLVADEX) 20 MG tablet TAKE 1 TABLET BY MOUTH DAILY 90 tablet 0   No current facility-administered medications for this visit.     PHYSICAL EXAMINATION: ECOG PERFORMANCE STATUS: 0 - Asymptomatic  Vitals:   08/14/18 1100  BP: (!) 108/59  Pulse: 65  Resp: 18    Temp: 97.8 F (36.6 C)  SpO2: 100%   Filed Weights   08/14/18 1100  Weight: 120 lb 4.8 oz (54.6 kg)    GENERAL:alert, no distress and comfortable SKIN: skin color, texture, turgor are normal, no rashes or significant lesions EYES: normal, Conjunctiva are pink and non-injected, sclera clear OROPHARYNX:no exudate, no erythema and lips, buccal mucosa, and tongue normal  NECK: supple, thyroid normal size, non-tender, without nodularity LYMPH:  no palpable lymphadenopathy in the cervical, axillary or inguinal LUNGS: clear to auscultation and percussion with normal breathing effort HEART: regular rate & rhythm and no murmurs and no lower extremity edema ABDOMEN:abdomen soft, non-tender and normal bowel sounds MUSCULOSKELETAL:no cyanosis of digits and no clubbing  NEURO: alert & oriented x 3 with fluent speech, no focal motor/sensory deficits EXTREMITIES: No lower extremity edema BREAST: No lumps or nodules in bilateral breast and axilla. (exam performed in the presence of a chaperone)  LABORATORY DATA:  I have reviewed the data as listed CMP Latest Ref Rng & Units 09/29/2015 04/02/2015 03/26/2015  Glucose 65 - 99 mg/dL 128(H) 106(H) 98  BUN 6 - 20 mg/dL 18 13 24(H)  Creatinine 0.44 - 1.00 mg/dL 0.73 0.69 0.82  Sodium 135 - 145 mmol/L 141 137 140  Potassium 3.5 - 5.1 mmol/L 3.8 4.2 4.2  Chloride 101 - 111 mmol/L 106 105 103  CO2 22 - 32 mmol/L 31 29 28   Calcium 8.9 - 10.3 mg/dL 8.9 8.1(L) 9.7  Total Protein 6.5 - 8.1 g/dL 5.8(L) - -  Total Bilirubin 0.3 - 1.2 mg/dL 0.8 - -  Alkaline Phos 38 - 126 U/L 43 - -  AST 15 - 41 U/L 42(H) - -  ALT 14 - 54 U/L 41 - -    Lab Results  Component Value Date   WBC 3.6 (L) 09/29/2015   HGB 12.7 09/29/2015   HCT 38.3 09/29/2015   MCV 95.3 09/29/2015   PLT 243 09/29/2015   NEUTROABS 2.1 09/29/2015    ASSESSMENT & PLAN:  Ductal carcinoma in situ (DCIS) of right breast Low-grade DCIS involving the right breast: ER/PR positive status post  right breast lumpectomy and radiation therapy. Started tamoxifen therapy on Oct 2015 S/P TAH BSO April 2016 Resection of the right axillary mass came back benign  Toxicities to tamoxifen: patient has no side effects of tamoxifen.  Breast Cancer Surveillance: 1. Breast exam  08/14/2018: no concern for recurrent breast cancer. 2. Mammogram 06/04/2018 :  No mammographic evidence of malignancy.  Breast density category C 3. Left axillary mass excision 10/02/15 : Benign fibroadipose tissue with foreign body giant cell reaction  F/U in one year  No orders of the defined types were placed in this encounter.  The patient has a good understanding of the overall plan. she agrees with it. she will call with any problems that may develop before the next visit here.   Harriette Ohara, MD 08/14/18

## 2018-08-14 NOTE — Telephone Encounter (Signed)
Gave patient avs and calendar.   °

## 2019-03-13 DIAGNOSIS — Z0000 Encounter for general adult medical examination without abnormal findings: Secondary | ICD-10-CM | POA: Diagnosis not present

## 2019-03-13 DIAGNOSIS — E785 Hyperlipidemia, unspecified: Secondary | ICD-10-CM | POA: Diagnosis not present

## 2019-03-13 DIAGNOSIS — Z5181 Encounter for therapeutic drug level monitoring: Secondary | ICD-10-CM | POA: Diagnosis not present

## 2019-05-20 ENCOUNTER — Other Ambulatory Visit: Payer: Self-pay | Admitting: Hematology and Oncology

## 2019-05-20 DIAGNOSIS — Z853 Personal history of malignant neoplasm of breast: Secondary | ICD-10-CM

## 2019-07-29 ENCOUNTER — Ambulatory Visit
Admission: RE | Admit: 2019-07-29 | Discharge: 2019-07-29 | Disposition: A | Discharge status: Home / Self Care | Payer: Commercial Managed Care - PPO | Source: Ambulatory Visit | Attending: Hematology and Oncology | Admitting: Hematology and Oncology

## 2019-07-29 ENCOUNTER — Other Ambulatory Visit: Payer: Self-pay

## 2019-07-29 DIAGNOSIS — Z853 Personal history of malignant neoplasm of breast: Secondary | ICD-10-CM

## 2019-08-07 ENCOUNTER — Other Ambulatory Visit: Payer: Self-pay | Admitting: Hematology and Oncology

## 2019-08-19 NOTE — Progress Notes (Signed)
Patient Care Team: Maurice Small, MD as PCP - General (Family Medicine)  DIAGNOSIS:    ICD-10-CM   1. Ductal carcinoma in situ (DCIS) of right breast  D05.11     SUMMARY OF ONCOLOGIC HISTORY: Oncology History  Ductal carcinoma in situ (DCIS) of right breast  05/28/2014 Initial Diagnosis   Screening mammogram detected calcifications in the right breast. Biopsy showed atypical ductal hyperplasia.    06/27/2014 Surgery   Right lumpectomy: low grade DCIS, 1.5cm, ER+ 98%, PR+ 82%, clear margins.    08/14/2014 - 09/11/2014 Radiation Therapy   Adjuvant radiation   09/2014 -  Anti-estrogen oral therapy   Tamoxifen 20mg  daily, s/p TAH BSO 03/2015     CHIEF COMPLIANT: Follow-up of right breast DCIS on tamoxifen  INTERVAL HISTORY: Sherry Weber is a 53 y.o. with above-mentioned history of DCIS of the right breast who is currently on tamoxifen. I last saw her a year ago. Mammogram on 07/29/19 showed no evidence of malignancy bilaterally. She presents to the clinic today for annual follow-up.  She has had no major problems or concerns with the breast.  She completed 5 years of therapy but she wants to stay on tamoxifen even longer.  She apparently has a year supply of tamoxifen sent by her [facility] and she wants to take them and finish them.  REVIEW OF SYSTEMS:   Constitutional: Denies fevers, chills or abnormal weight loss Eyes: Denies blurriness of vision Ears, nose, mouth, throat, and face: Denies mucositis or sore throat Respiratory: Denies cough, dyspnea or wheezes Cardiovascular: Denies palpitation, chest discomfort Gastrointestinal: Denies nausea, heartburn or change in bowel habits Skin: Denies abnormal skin rashes Lymphatics: Denies new lymphadenopathy or easy bruising Neurological: Denies numbness, tingling or new weaknesses Behavioral/Psych: Mood is stable, no new changes  Extremities: No lower extremity edema Breast: denies any pain or lumps or nodules in either breasts All  other systems were reviewed with the patient and are negative.  I have reviewed the past medical history, past surgical history, social history and family history with the patient and they are unchanged from previous note.  ALLERGIES:  has No Known Allergies.  MEDICATIONS:  Current Outpatient Medications  Medication Sig Dispense Refill  . acetaminophen (TYLENOL) 325 MG tablet Take 650 mg by mouth every 6 (six) hours as needed for mild pain.     . tamoxifen (NOLVADEX) 20 MG tablet TAKE 1 TABLET BY MOUTH EVERY DAY 90 tablet 0   No current facility-administered medications for this visit.     PHYSICAL EXAMINATION: ECOG PERFORMANCE STATUS: 0 - Asymptomatic  Vitals:   08/20/19 1048  BP: 111/78  Pulse: 81  Resp: 17  Temp: 98.2 F (36.8 C)  SpO2: 98%   Filed Weights   08/20/19 1048  Weight: 128 lb 6.4 oz (58.2 kg)    GENERAL: alert, no distress and comfortable SKIN: skin color, texture, turgor are normal, no rashes or significant lesions EYES: normal, Conjunctiva are pink and non-injected, sclera clear OROPHARYNX: no exudate, no erythema and lips, buccal mucosa, and tongue normal  NECK: supple, thyroid normal size, non-tender, without nodularity LYMPH: no palpable lymphadenopathy in the cervical, axillary or inguinal LUNGS: clear to auscultation and percussion with normal breathing effort HEART: regular rate & rhythm and no murmurs and no lower extremity edema ABDOMEN: abdomen soft, non-tender and normal bowel sounds MUSCULOSKELETAL: no cyanosis of digits and no clubbing  NEURO: alert & oriented x 3 with fluent speech, no focal motor/sensory deficits EXTREMITIES: No lower extremity edema  BREAST: No palpable masses or nodules in either right or left breasts. No palpable axillary supraclavicular or infraclavicular adenopathy no breast tenderness or nipple discharge. (exam performed in the presence of a chaperone)  LABORATORY DATA:  I have reviewed the data as listed CMP Latest  Ref Rng & Units 09/29/2015 04/02/2015 03/26/2015  Glucose 65 - 99 mg/dL 128(H) 106(H) 98  BUN 6 - 20 mg/dL 18 13 24(H)  Creatinine 0.44 - 1.00 mg/dL 0.73 0.69 0.82  Sodium 135 - 145 mmol/L 141 137 140  Potassium 3.5 - 5.1 mmol/L 3.8 4.2 4.2  Chloride 101 - 111 mmol/L 106 105 103  CO2 22 - 32 mmol/L 31 29 28   Calcium 8.9 - 10.3 mg/dL 8.9 8.1(L) 9.7  Total Protein 6.5 - 8.1 g/dL 5.8(L) - -  Total Bilirubin 0.3 - 1.2 mg/dL 0.8 - -  Alkaline Phos 38 - 126 U/L 43 - -  AST 15 - 41 U/L 42(H) - -  ALT 14 - 54 U/L 41 - -    Lab Results  Component Value Date   WBC 3.6 (L) 09/29/2015   HGB 12.7 09/29/2015   HCT 38.3 09/29/2015   MCV 95.3 09/29/2015   PLT 243 09/29/2015   NEUTROABS 2.1 09/29/2015    ASSESSMENT & PLAN:  Ductal carcinoma in situ (DCIS) of right breast Low-grade DCIS involving the right breast: ER/PR positive status post right breast lumpectomy and radiation therapy. Started tamoxifen therapy on Oct 2015 S/P TAH BSO April 2016 Resection of the right axillary mass came back benign  Toxicities to tamoxifen: patient has no side effects of tamoxifen. She will complete 5 years of tamoxifen by next month.  Patient has about a year supply of tamoxifen left.  So she wants to continue to take them.  Breast Cancer Surveillance: 1. Breast exam  and 15 2020: noconcernfor recurrent breast cancer. 2. Mammogram  07/29/2019  No mammographic evidence of malignancy.  Breast density category B 3. 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  F/U in one year  No orders of the defined types were placed in this encounter.  The patient has a good understanding of the overall plan. she agrees with it. she will call with any problems that may develop before the next visit here.  Nicholas Lose, MD 08/20/2019  Julious Oka Dorshimer am acting as scribe for  Dr. Nicholas Lose.  I have reviewed the above documentation for accuracy and completeness, and I agree with the above.

## 2019-08-20 ENCOUNTER — Inpatient Hospital Stay: Payer: Commercial Managed Care - PPO | Attending: Hematology and Oncology | Admitting: Hematology and Oncology

## 2019-08-20 ENCOUNTER — Other Ambulatory Visit: Payer: Self-pay

## 2019-08-20 DIAGNOSIS — Z170 Estrogen receptor positive status [ER+]: Secondary | ICD-10-CM | POA: Insufficient documentation

## 2019-08-20 DIAGNOSIS — D0511 Intraductal carcinoma in situ of right breast: Secondary | ICD-10-CM | POA: Diagnosis not present

## 2019-08-20 DIAGNOSIS — Z79810 Long term (current) use of selective estrogen receptor modulators (SERMs): Secondary | ICD-10-CM | POA: Insufficient documentation

## 2019-08-20 NOTE — Assessment & Plan Note (Signed)
Low-grade DCIS involving the right breast: ER/PR positive status post right breast lumpectomy and radiation therapy. Started tamoxifen therapy on Oct 2015 S/P TAH BSO April 2016 Resection of the right axillary mass came back benign  Toxicities to tamoxifen: patient has no side effects of tamoxifen. She will complete 5 years of tamoxifen by next month.  After that she can discontinue it.  Breast Cancer Surveillance: 1. Breast exam  and 15 2020: noconcernfor recurrent breast cancer. 2. Mammogram  07/29/2019  No mammographic evidence of malignancy.  Breast density category B 3. Left axillary mass excision 10/02/15 : Benign fibroadipose tissue with foreign body giant cell reaction  F/U in one year

## 2019-08-21 ENCOUNTER — Telehealth: Payer: Self-pay | Admitting: Hematology and Oncology

## 2019-08-21 NOTE — Telephone Encounter (Signed)
I talk with patient regarding schedule  

## 2020-06-19 ENCOUNTER — Other Ambulatory Visit: Payer: Self-pay | Admitting: Hematology and Oncology

## 2020-06-19 DIAGNOSIS — Z9889 Other specified postprocedural states: Secondary | ICD-10-CM

## 2020-08-18 ENCOUNTER — Other Ambulatory Visit: Payer: Self-pay

## 2020-08-18 ENCOUNTER — Ambulatory Visit
Admission: RE | Admit: 2020-08-18 | Discharge: 2020-08-18 | Disposition: A | Discharge status: Home / Self Care | Payer: Commercial Managed Care - PPO | Source: Ambulatory Visit | Attending: Hematology and Oncology | Admitting: Hematology and Oncology

## 2020-08-18 DIAGNOSIS — Z9889 Other specified postprocedural states: Secondary | ICD-10-CM

## 2020-08-20 ENCOUNTER — Ambulatory Visit: Payer: Commercial Managed Care - PPO | Admitting: Hematology and Oncology

## 2021-02-11 IMAGING — MG DIGITAL DIAGNOSTIC BILAT W/ TOMO W/ CAD
8 series · 9 of 24 positions shown · non-contrast
Comparison: Previous exam(s).

CLINICAL DATA: History of a left lumpectomy for breast carcinoma in
1083. History of a benign excisional biopsy of the left axilla in
6753. No current breast complaints.

EXAM:
DIGITAL DIAGNOSTIC BILATERAL MAMMOGRAM WITH TOMO AND CAD

[R CC synth-2D]
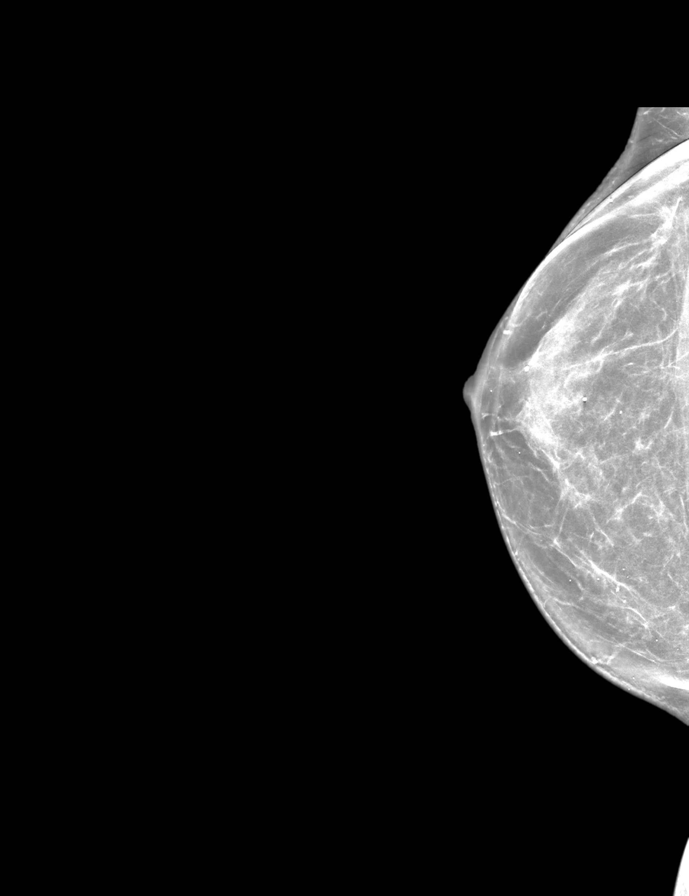

[L MLO synth-2D]
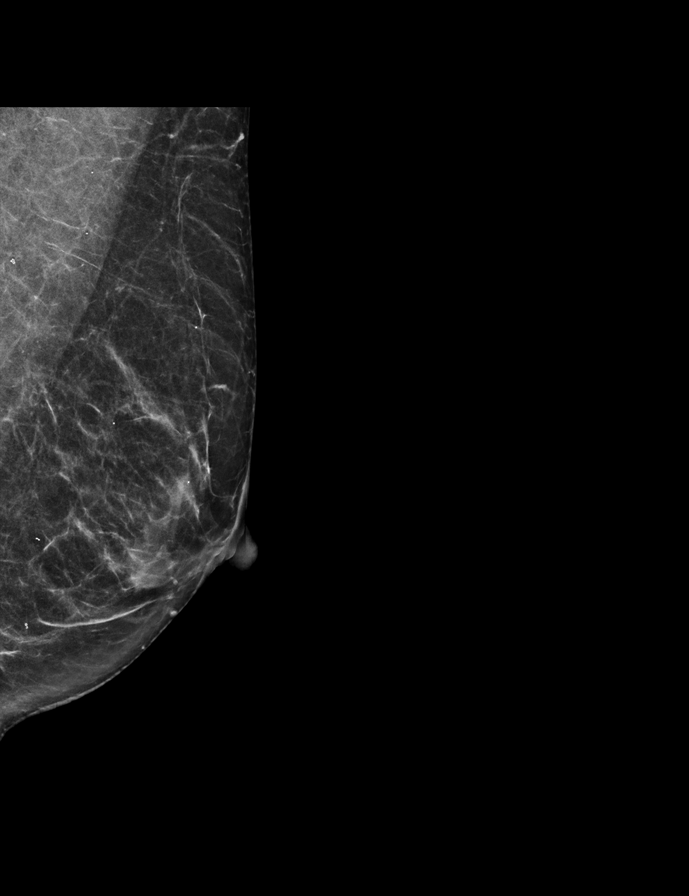

[R MLO synth-2D]
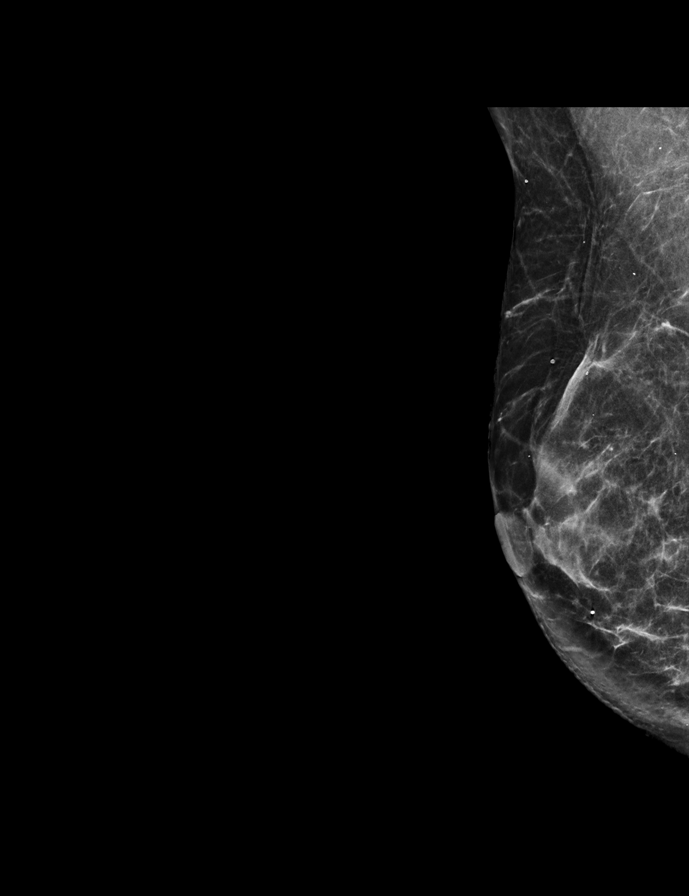

[L CC synth-2D]
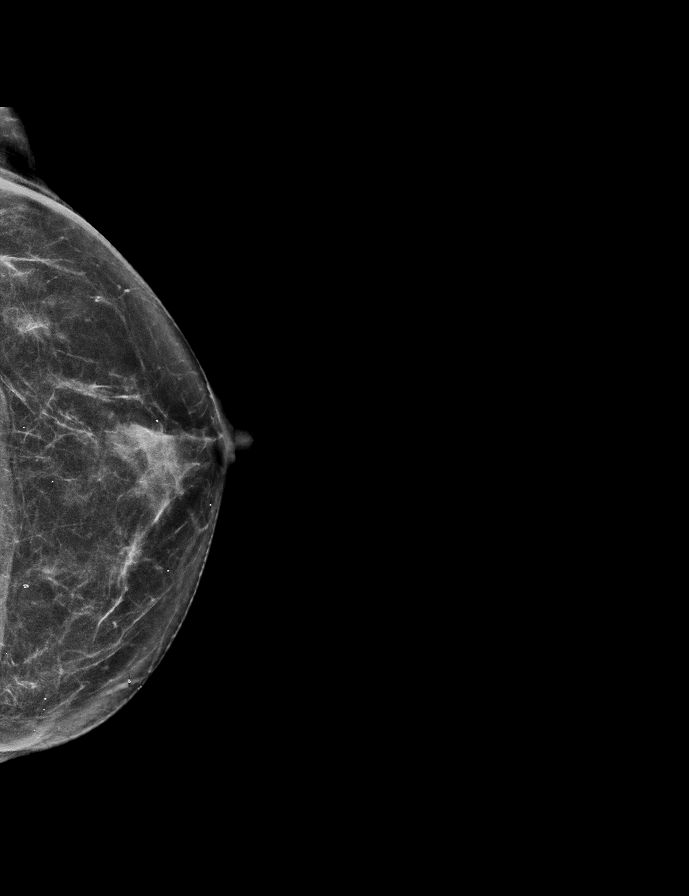

[R CC tomo · 2 of 65 frames shown]
[frame 21/65]
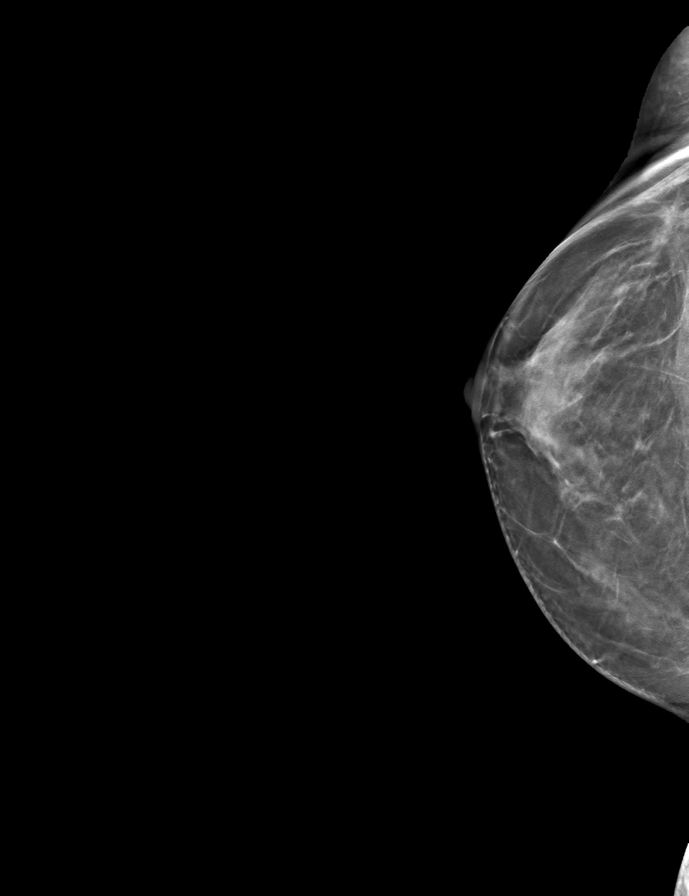
[frame 33/65]
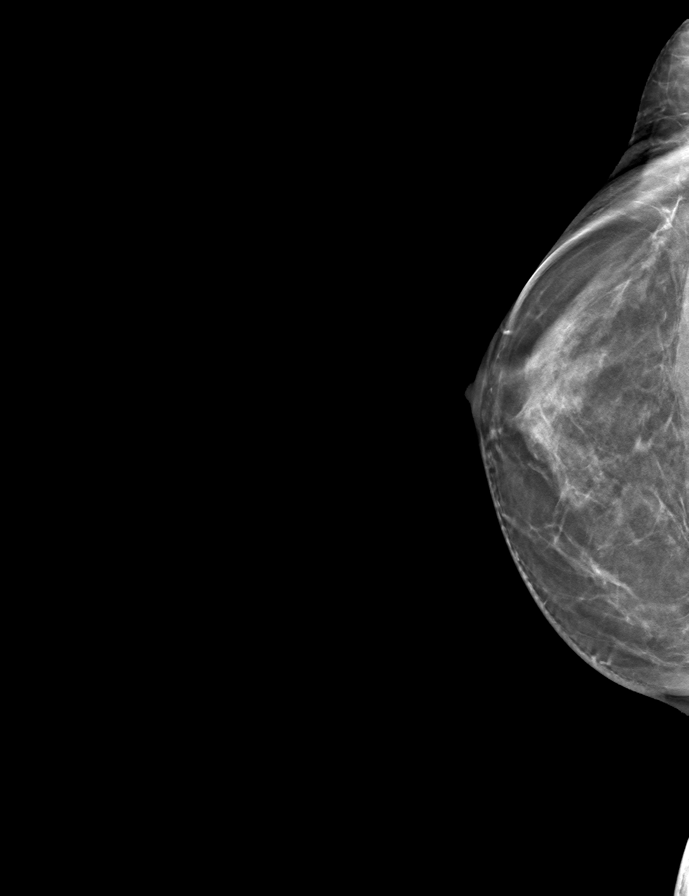

[L CC tomo · tomo slice 32/63.0]
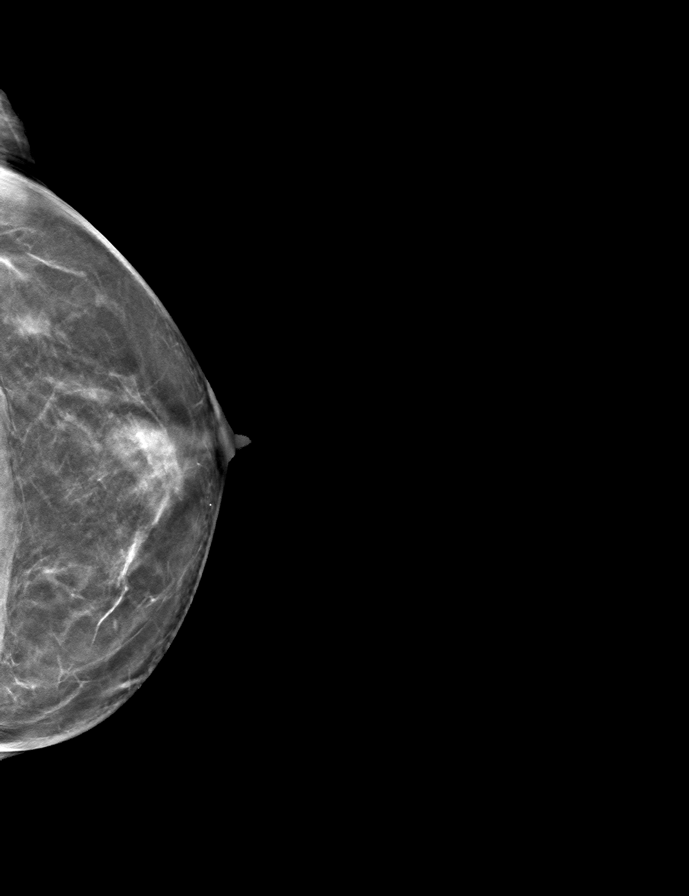

[L MLO tomo · tomo slice 27/54.0]
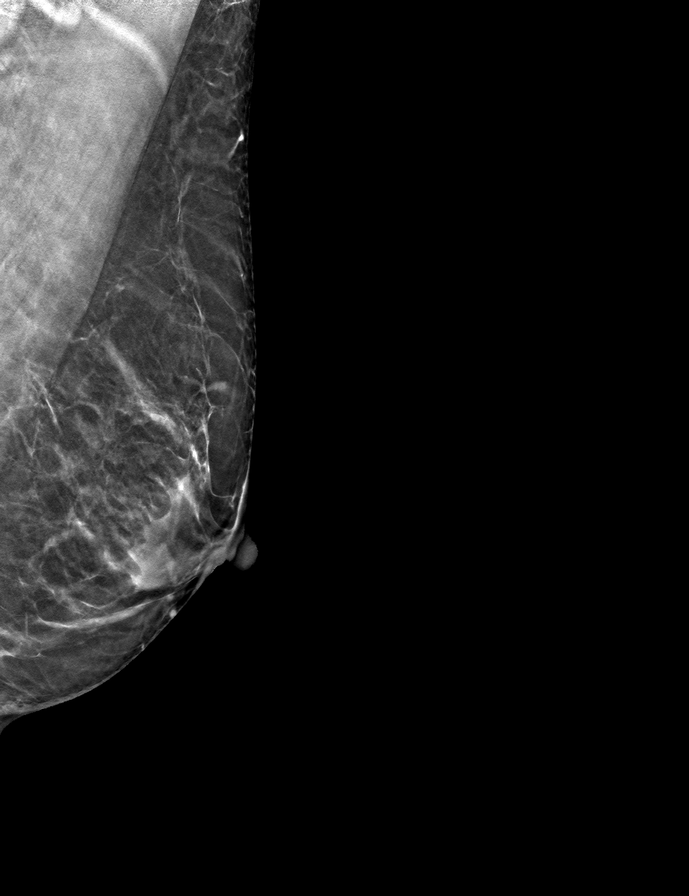

[R MLO tomo · tomo slice 29/57.0]
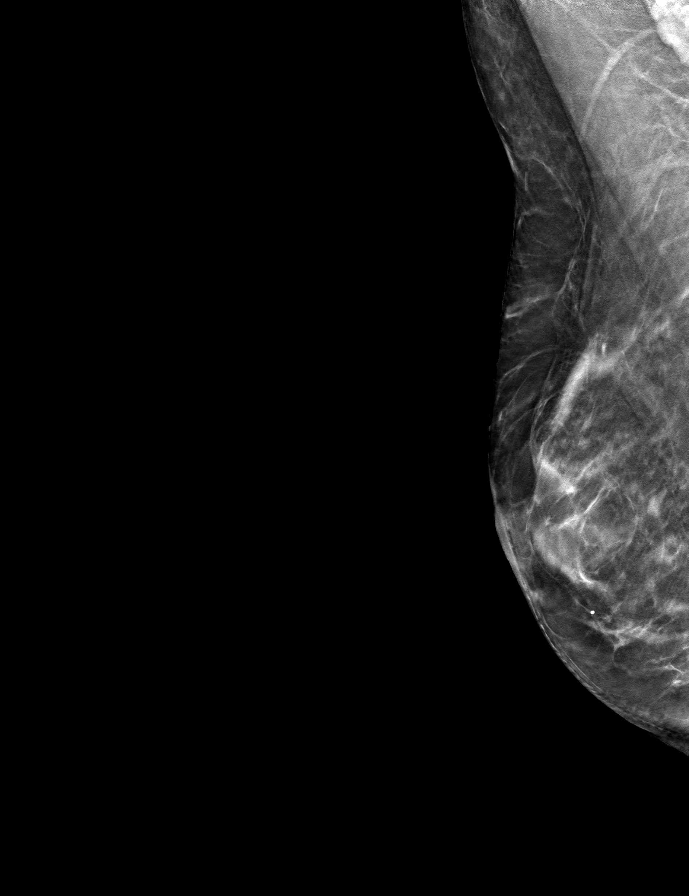

[9 of 24 positions shown; findings below may reference images not displayed]

ACR Breast Density Category b: There are scattered areas of
fibroglandular density.
FINDINGS: Post lumpectomy changes in the upper outer right breast are stable.

There are no masses or areas of nonsurgical architectural
distortion. There are no suspicious calcifications.

Mammographic images were processed with CAD.
IMPRESSION: 1. No evidence of new or recurrent breast carcinoma.
2. Benign post lumpectomy changes on the right.

RECOMMENDATION:
Screening mammogram in one year.(Code:[id number])

I have discussed the findings and recommendations with the patient.
If applicable, a reminder letter will be sent to the patient
regarding the next appointment.

BI-RADS CATEGORY  2: Benign.
